# Patient Record
Sex: Female | Born: 1965 | Race: White | Hispanic: No | Marital: Single | Smoking: Never smoker
Health system: Southern US, Community
[De-identification: ages and names within clinical notes are randomized; demographics above are authoritative.]

## PROBLEM LIST (undated history)

## (undated) DIAGNOSIS — K5190 Ulcerative colitis, unspecified, without complications: Secondary | ICD-10-CM

## (undated) DIAGNOSIS — R32 Unspecified urinary incontinence: Secondary | ICD-10-CM

## (undated) DIAGNOSIS — F909 Attention-deficit hyperactivity disorder, unspecified type: Secondary | ICD-10-CM

## (undated) DIAGNOSIS — F32A Depression, unspecified: Secondary | ICD-10-CM

## (undated) DIAGNOSIS — K219 Gastro-esophageal reflux disease without esophagitis: Secondary | ICD-10-CM

## (undated) DIAGNOSIS — F329 Major depressive disorder, single episode, unspecified: Secondary | ICD-10-CM

## (undated) DIAGNOSIS — M810 Age-related osteoporosis without current pathological fracture: Secondary | ICD-10-CM

## (undated) HISTORY — DX: Gastro-esophageal reflux disease without esophagitis: K21.9

## (undated) HISTORY — DX: Attention-deficit hyperactivity disorder, unspecified type: F90.9

## (undated) HISTORY — DX: Ulcerative colitis, unspecified, without complications: K51.90

## (undated) HISTORY — DX: Age-related osteoporosis without current pathological fracture: M81.0

## (undated) HISTORY — PX: COLONOSCOPY: SHX174

## (undated) HISTORY — DX: Unspecified urinary incontinence: R32

## (undated) HISTORY — PX: CHOLECYSTECTOMY: SHX55

## (undated) HISTORY — PX: MOUTH SURGERY: SHX715

## (undated) HISTORY — DX: Depression, unspecified: F32.A

## (undated) HISTORY — PX: APPENDECTOMY: SHX54

## (undated) HISTORY — DX: Major depressive disorder, single episode, unspecified: F32.9

---

## 2002-11-12 ENCOUNTER — Encounter: Payer: Self-pay | Admitting: Emergency Medicine

## 2002-11-12 ENCOUNTER — Emergency Department (HOSPITAL_COMMUNITY): Admission: EM | Admit: 2002-11-12 | Discharge: 2002-11-13 | Payer: Self-pay | Admitting: Emergency Medicine

## 2012-11-27 ENCOUNTER — Other Ambulatory Visit: Payer: Self-pay | Admitting: Certified Nurse Midwife

## 2012-11-27 NOTE — Telephone Encounter (Signed)
Chart in your in basket.

## 2012-11-27 NOTE — Telephone Encounter (Signed)
No AEX scheduled in Epic, patient was last seen 01/11/12. Last Vit D was done at AEX was at 25 no rx was given  Please advise.

## 2016-03-10 ENCOUNTER — Encounter: Payer: Self-pay | Admitting: Obstetrics and Gynecology

## 2016-03-10 ENCOUNTER — Ambulatory Visit (INDEPENDENT_AMBULATORY_CARE_PROVIDER_SITE_OTHER): Payer: BC Managed Care – PPO | Admitting: Obstetrics and Gynecology

## 2016-03-10 VITALS — BP 110/80 | HR 108 | Resp 16 | Ht 64.5 in | Wt 201.0 lb

## 2016-03-10 DIAGNOSIS — N393 Stress incontinence (female) (male): Secondary | ICD-10-CM

## 2016-03-10 DIAGNOSIS — R3915 Urgency of urination: Secondary | ICD-10-CM | POA: Diagnosis not present

## 2016-03-10 DIAGNOSIS — Z01419 Encounter for gynecological examination (general) (routine) without abnormal findings: Secondary | ICD-10-CM

## 2016-03-10 DIAGNOSIS — Z124 Encounter for screening for malignant neoplasm of cervix: Secondary | ICD-10-CM | POA: Diagnosis not present

## 2016-03-10 DIAGNOSIS — N951 Menopausal and female climacteric states: Secondary | ICD-10-CM | POA: Diagnosis not present

## 2016-03-10 DIAGNOSIS — N926 Irregular menstruation, unspecified: Secondary | ICD-10-CM | POA: Diagnosis not present

## 2016-03-10 DIAGNOSIS — Z0000 Encounter for general adult medical examination without abnormal findings: Secondary | ICD-10-CM

## 2016-03-10 DIAGNOSIS — Z23 Encounter for immunization: Secondary | ICD-10-CM

## 2016-03-10 LAB — CBC
HCT: 40.4 % (ref 35.0–45.0)
Hemoglobin: 13.2 g/dL (ref 11.7–15.5)
MCH: 28.5 pg (ref 27.0–33.0)
MCHC: 32.7 g/dL (ref 32.0–36.0)
MCV: 87.3 fL (ref 80.0–100.0)
MPV: 9.9 fL (ref 7.5–12.5)
PLATELETS: 234 10*3/uL (ref 140–400)
RBC: 4.63 MIL/uL (ref 3.80–5.10)
RDW: 14 % (ref 11.0–15.0)
WBC: 7.1 10*3/uL (ref 3.8–10.8)

## 2016-03-10 NOTE — Patient Instructions (Signed)
EXERCISE AND DIET:  We recommended that you start or continue a regular exercise program for good health. Regular exercise means any activity that makes your heart beat faster and makes you sweat.  We recommend exercising at least 30 minutes per day at least 3 days a week, preferably 4 or 5.  We also recommend a diet low in fat and sugar.  Inactivity, poor dietary choices and obesity can cause diabetes, heart attack, stroke, and kidney damage, among others.    ALCOHOL AND SMOKING:  Women should limit their alcohol intake to no more than 7 drinks/beers/glasses of wine (combined, not each!) per week. Moderation of alcohol intake to this level decreases your risk of breast cancer and liver damage. And of course, no recreational drugs are part of a healthy lifestyle.  And absolutely no smoking or even second hand smoke. Most people know smoking can cause heart and lung diseases, but did you know it also contributes to weakening of your bones? Aging of your skin?  Yellowing of your teeth and nails?  CALCIUM AND VITAMIN D:  Adequate intake of calcium and Vitamin D are recommended.  The recommendations for exact amounts of these supplements seem to change often, but generally speaking 600 mg of calcium (either carbonate or citrate) and 800 units of Vitamin D per day seems prudent. Certain women may benefit from higher intake of Vitamin D.  If you are among these women, your doctor will have told you during your visit.    PAP SMEARS:  Pap smears, to check for cervical cancer or precancers,  have traditionally been done yearly, although recent scientific advances have shown that most women can have pap smears less often.  However, every woman still should have a physical exam from her gynecologist every year. It will include a breast check, inspection of the vulva and vagina to check for abnormal growths or skin changes, a visual exam of the cervix, and then an exam to evaluate the size and shape of the uterus and  ovaries.  And after 51 years of age, a rectal exam is indicated to check for rectal cancers. We will also provide age appropriate advice regarding health maintenance, like when you should have certain vaccines, screening for sexually transmitted diseases, bone density testing, colonoscopy, mammograms, etc.   MAMMOGRAMS:  All women over 40 years old should have a yearly mammogram. Many facilities now offer a "3D" mammogram, which may cost around $50 extra out of pocket. If possible,  we recommend you accept the option to have the 3D mammogram performed.  It both reduces the number of women who will be called back for extra views which then turn out to be normal, and it is better than the routine mammogram at detecting truly abnormal areas.    COLONOSCOPY:  Colonoscopy to screen for colon cancer is recommended for all women at age 50.  We know, you hate the idea of the prep.  We agree, BUT, having colon cancer and not knowing it is worse!!  Colon cancer so often starts as a polyp that can be seen and removed at colonscopy, which can quite literally save your life!  And if your first colonoscopy is normal and you have no family history of colon cancer, most women don't have to have it again for 10 years.  Once every ten years, you can do something that may end up saving your life, right?  We will be happy to help you get it scheduled when you are ready.    Be sure to check your insurance coverage so you understand how much it will cost.  It may be covered as a preventative service at no cost, but you should check your particular policy.     Kegel Exercises The goal of Kegel exercises is to isolate and exercise your pelvic floor muscles. These muscles act as a hammock that supports the rectum, vagina, small intestine, and uterus. As the muscles weaken, the hammock sags and these organs are displaced from their normal positions. Kegel exercises can strengthen your pelvic floor muscles and help you to improve  bladder and bowel control, improve sexual response, and help reduce many problems and some discomfort during pregnancy. Kegel exercises can be done anywhere and at any time. HOW TO PERFORM KEGEL EXERCISES 1. Locate your pelvic floor muscles. To do this, squeeze (contract) the muscles that you use when you try to stop the flow of urine. You will feel a tightness in the vaginal area (women) and a tight lift in the rectal area (men and women). 2. When you begin, contract your pelvic muscles tight for 2-5 seconds, then relax them for 2-5 seconds. This is one set. Do 4-5 sets with a short pause in between. 3. Contract your pelvic muscles for 8-10 seconds, then relax them for 8-10 seconds. Do 4-5 sets. If you cannot contract your pelvic muscles for 8-10 seconds, try 5-7 seconds and work your way up to 8-10 seconds. Your goal is 4-5 sets of 10 contractions each day. Keep your stomach, buttocks, and legs relaxed during the exercises. Perform sets of both short and long contractions. Vary your positions. Perform these contractions 3-4 times per day. Perform sets while you are:   Lying in bed in the morning.  Standing at lunch.  Sitting in the late afternoon.  Lying in bed at night. You should do 40-50 contractions per day. Do not perform more Kegel exercises per day than recommended. Overexercising can cause muscle fatigue. Continue these exercises for for at least 15-20 weeks or as directed by your caregiver. This information is not intended to replace advice given to you by your health care provider. Make sure you discuss any questions you have with your health care provider. Document Released: 02/09/2012 Document Revised: 03/15/2014 Document Reviewed: 01/12/2015 Elsevier Interactive Patient Education  2017 [employer]  

## 2016-03-10 NOTE — Progress Notes (Signed)
51 y.o. G0P0000 SingleCaucasianF here for annual exam.  She hasn't had a cycle for 7-11 months, maybe more. Prior to that they were irregular. She had some spotting about a month ago for 2 days.  She has a female partner, lives together x 20 years. Infrequently sexually active, partner struggles with her sexuality. She c/o urinary incontinence with jumping, coughing and sneezing. No urge incontinence. Having urgency to void. No frequency. She leaks a small amount weekly (worse with a cold). Just a few drops.  She has been dealing with incontinence for last 5 years. The urgency has worsened with time.  She is having tolerable night sweats. No hot flashes or vaginal dryness.  She c/o headaches every couple of weeks, can be very bad.     No LMP recorded (lmp unknown). Patient is not currently having periods (Reason: Perimenopausal).          Sexually active: No.  The current method of family planning is none.    Exercising: No Smoker:  no  Health Maintenance: Pap:  4 years ago WNL per patient History of abnormal Pap:  no MMG:  Never Colonoscopy:  Never BMD:  Never  TDaP: unsure Gardasil: N/A   reports that she has never smoked. She has never used smokeless tobacco. She reports that she drinks about 1.2 - 1.8 oz of alcohol per week . She reports that she does not use drugs. 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 She has her masters.   Past Medical History:  Diagnosis Date  . ADHD   . Depression   . Urinary incontinence     Past Surgical History:  Procedure Laterality Date  . CHOLECYSTECTOMY      Current Outpatient Prescriptions  Medication Sig Dispense Refill  . amphetamine-dextroamphetamine (ADDERALL) 20 MG tablet Take 20 mg by mouth daily.    Marland Kitchen PRISTIQ 100 MG 24 hr tablet Take 100 mg by mouth daily.  6  . VYVANSE 50 MG capsule Take 50 mg by mouth daily.  0   No current facility-administered medications for this visit.     Family History  Problem Relation Age of Onset  .  Scoliosis Mother   . Osteoporosis Mother   . Hypertension Father   . Hypertension Maternal Grandmother   . Hodgkin's lymphoma Maternal Grandmother     Review of Systems  Constitutional: Negative.   HENT: Negative.   Eyes: Negative.   Respiratory: Negative.   Cardiovascular: Negative.   Gastrointestinal: Negative.   Endocrine: Negative.   Genitourinary:       Loss of urine with sneezing/ cough  Musculoskeletal: Negative.   Skin: Negative.   Allergic/Immunologic: Negative.   Neurological: Negative.   Psychiatric/Behavioral: Negative.     Exam:   BP 110/80 (BP Location: Right Arm, Patient Position: Sitting, Cuff Size: Normal)   Pulse (!) 108   Resp 16   Ht 5' 4.5" (1.638 m)   Wt 201 lb (91.2 kg)   LMP  (LMP Unknown)   BMI 33.97 kg/m   Weight change: @WEIGHTCHANGE @ Height:   Height: 5' 4.5" (163.8 cm)  Ht Readings from Last 3 Encounters:  03/10/16 5' 4.5" (1.638 m)    General appearance: alert, cooperative and appears stated age Head: Normocephalic, without obvious abnormality, atraumatic Neck: no adenopathy, supple, symmetrical, trachea midline and thyroid normal to inspection and palpation Lungs: clear to auscultation bilaterally Cardiovascular: regular rate and rhythm Breasts: normal appearance, no masses or tenderness Heart: regular rate and rhythm Abdomen: soft, non-tender; bowel sounds  normal; no masses,  no organomegaly Extremities: extremities normal, atraumatic, no cyanosis or edema Skin: Skin color, texture, turgor normal. No rashes or lesions Lymph nodes: Cervical, supraclavicular, and axillary nodes normal. No abnormal inguinal nodes palpated Neurologic: Grossly normal   Pelvic: External genitalia:  no lesions              Urethra:  normal appearing urethra with no masses, tenderness or lesions              Bartholins and Skenes: normal                 Vagina: normal appearing vagina with normal color and discharge, no lesions              Cervix: no  lesions and stenotic               Bimanual Exam:  Uterus:  normal size, contour, position, consistency, mobility, non-tender              Adnexa: no mass, fullness, tenderness               Rectovaginal: Confirms               Anus:  normal sphincter tone, no lesions  Chaperone was present for exam.  A:  Well Woman with normal exam  GSI, some urgency to void  Irregular perimenopausal bleeding  Stenotic cervix    P:   Pap with hpv  Urine for ua, c&s  Screening labs  FSH/estradiol  Recommend she return for a GYN ultrasound, possible endometrial biopsy (will pre-treat with cytotec)  # for GI's given to set up colonoscopy  Mammogram, # given  TDAP

## 2016-03-11 LAB — LIPID PANEL
Cholesterol: 139 mg/dL (ref <200)
HDL: 72 mg/dL (ref >50)
LDL CALC: 55 mg/dL (ref <100)
Total CHOL/HDL Ratio: 1.9 Ratio (ref <5.0)
Triglycerides: 59 mg/dL (ref <150)
VLDL: 12 mg/dL (ref <30)

## 2016-03-11 LAB — COMPREHENSIVE METABOLIC PANEL
ALK PHOS: 80 U/L (ref 33–130)
ALT: 15 U/L (ref 6–29)
AST: 23 U/L (ref 10–35)
Albumin: 4.3 g/dL (ref 3.6–5.1)
BILIRUBIN TOTAL: 0.5 mg/dL (ref 0.2–1.2)
BUN: 13 mg/dL (ref 7–25)
CO2: 21 mmol/L (ref 20–31)
CREATININE: 0.85 mg/dL (ref 0.50–1.05)
Calcium: 9.4 mg/dL (ref 8.6–10.4)
Chloride: 106 mmol/L (ref 98–110)
GLUCOSE: 90 mg/dL (ref 65–99)
Potassium: 4.4 mmol/L (ref 3.5–5.3)
SODIUM: 140 mmol/L (ref 135–146)
Total Protein: 7.6 g/dL (ref 6.1–8.1)

## 2016-03-11 LAB — URINALYSIS, MICROSCOPIC ONLY
Bacteria, UA: NONE SEEN [HPF]
CASTS: NONE SEEN [LPF]
CRYSTALS: NONE SEEN [HPF]
WBC, UA: NONE SEEN WBC/HPF (ref <=5)
YEAST: NONE SEEN [HPF]

## 2016-03-11 LAB — VITAMIN D 25 HYDROXY (VIT D DEFICIENCY, FRACTURES): VIT D 25 HYDROXY: 22 ng/mL — AB (ref 30–100)

## 2016-03-11 LAB — ESTRADIOL: Estradiol: 33 pg/mL

## 2016-03-11 LAB — FOLLICLE STIMULATING HORMONE: FSH: 119.5 m[IU]/mL — AB

## 2016-03-12 ENCOUNTER — Telehealth: Payer: Self-pay | Admitting: *Deleted

## 2016-03-12 LAB — IPS PAP TEST WITH HPV

## 2016-03-12 LAB — URINE CULTURE

## 2016-03-12 NOTE — Telephone Encounter (Signed)
Call to patient, per ROI can leave detailed message on cell number. Left message that Dr Talbert Nan wanted her to know urine results show no sign of UTI and to call back on Monday for additional information. Nothing wrong, no emergency and no sign of UTI.

## 2016-03-12 NOTE — Telephone Encounter (Signed)
-----   Message from Salvadore Dom, MD sent at 03/12/2016  9:51 AM EST ----- Please inform the patient she doesn't have a UTI. Her blood work is in the postmenopausal range, she still has low levels of estradiol which could be stimulating the lining of her uterus (another reason to further evaluate her). She is being set up for an ultrasound and possible biopsy (she is aware). Her vit D is low, she should start taking 1,000 IU of vit D3 daily The rest of her blood work was normal Her pap is still pending

## 2016-03-15 ENCOUNTER — Telehealth: Payer: Self-pay | Admitting: Obstetrics and Gynecology

## 2016-03-15 MED ORDER — MISOPROSTOL 200 MCG PO TABS: ORAL_TABLET | ORAL | 0 refills | Status: DC

## 2016-03-15 NOTE — Telephone Encounter (Signed)
Called patient to review benefits for a recommended procedure. Left Voicemail requesting a call back. °

## 2016-03-15 NOTE — Telephone Encounter (Signed)
Spoke with patient regarding benefit for ultrasound. Patient understood and agreeable. Patient ready to schedule. Patient unable to schedule ultrasound on Tuesday afternoons, due to staff meeting requirements with her employer.  Patient scheduled Thursday, 03/18/16 with Dr Talbert Nan. Patient aware of date, arrival time and cancellation policy.  No further questions.   Routing to Dr Talbert Nan to advise

## 2016-03-15 NOTE — Telephone Encounter (Signed)
Patient is returning your call.  

## 2016-03-15 NOTE — Telephone Encounter (Signed)
See previous encounter regarding result note from Dr Talbert Nan and instructions for endometrial biopsy.   Call back to patient to clarify instructions for Cytotec. Left message to call back.

## 2016-03-15 NOTE — Telephone Encounter (Signed)
Patient returned call. Results reviewed with patient as directed by Dr Talbert Nan. Patient aware of [place] and Estradiol levels as well as recommendation for endometrial biopsy. Will plan to proceed with this with pelvic ultrasound on 03-18-16. Advised of Vitamin D level and recommendation for OTC supplement of 1000 IU daily.  Patient confirmed she received message that urine culture was negative.  Patient is aware to take Motrin with food prior to endometrial biopsy. She has also been instructed to take Cytotec 6-12 hours prior to procedure.   Routing to provider for final review. Patient agreeable to disposition. Will close encounter.

## 2016-03-15 NOTE — Telephone Encounter (Signed)
Call back to patient. Clarified that Cytotec should be inserted vaginally.  Routing to provider for final review. Patient agreeable to disposition. Will close encounter.

## 2016-03-16 ENCOUNTER — Telehealth: Payer: Self-pay | Admitting: Obstetrics & Gynecology

## 2016-03-16 NOTE — Telephone Encounter (Signed)
Return call to patient. Left message to call back. Can also speak with triage nurse.

## 2016-03-16 NOTE — Telephone Encounter (Signed)
Patient wants to speak with you.  Tried to get a little more information but patient refused.

## 2016-03-17 ENCOUNTER — Encounter: Payer: Self-pay | Admitting: Family Medicine

## 2016-03-17 ENCOUNTER — Telehealth: Payer: Self-pay | Admitting: Obstetrics and Gynecology

## 2016-03-17 ENCOUNTER — Ambulatory Visit (INDEPENDENT_AMBULATORY_CARE_PROVIDER_SITE_OTHER): Payer: BC Managed Care – PPO | Admitting: Family Medicine

## 2016-03-17 VITALS — BP 140/92 | HR 105 | Temp 98.6°F | Resp 16 | Ht 64.5 in | Wt 199.0 lb

## 2016-03-17 DIAGNOSIS — Z23 Encounter for immunization: Secondary | ICD-10-CM | POA: Diagnosis not present

## 2016-03-17 DIAGNOSIS — N959 Unspecified menopausal and perimenopausal disorder: Secondary | ICD-10-CM | POA: Diagnosis not present

## 2016-03-17 DIAGNOSIS — J069 Acute upper respiratory infection, unspecified: Secondary | ICD-10-CM | POA: Diagnosis not present

## 2016-03-17 DIAGNOSIS — F322 Major depressive disorder, single episode, severe without psychotic features: Secondary | ICD-10-CM | POA: Diagnosis not present

## 2016-03-17 MED ORDER — FLUTICASONE PROPIONATE 50 MCG/ACT NA SUSP: 2.0000 | Freq: Every day | NASAL | 6 refills | Status: DC

## 2016-03-17 NOTE — Patient Instructions (Addendum)
IF you received an x-ray today, you will receive an invoice from [facility]. Please contact [facility] at [phone] with questions or concerns regarding your invoice.   IF you received labwork today, you will receive an invoice from [facility]. Please contact [facility] at [phone] with questions or concerns regarding your invoice.   Our billing staff will not be able to assist you with questions regarding bills from these companies.  You will be contacted with the lab results as soon as they are available. The fastest way to get your results is to activate your My Chart account. Instructions are located on the last page of this paperwork. If you have not heard from [place] regarding the results in 2 weeks, please contact this office.      Upper Respiratory Infection, Adult Most upper respiratory infections (URIs) are a viral infection of the air passages leading to the lungs. A URI affects the nose, throat, and upper air passages. The most common type of URI is nasopharyngitis and is typically referred to as "the common cold." URIs run their course and usually go away on their own. Most of the time, a URI does not require medical attention, but sometimes a bacterial infection in the upper airways can follow a viral infection. This is called a secondary infection. Sinus and middle ear infections are common types of secondary upper respiratory infections. Bacterial pneumonia can also complicate a URI. A URI can worsen asthma and chronic obstructive pulmonary disease (COPD). Sometimes, these complications can require emergency medical care and may be life threatening. What are the causes? Almost all URIs are caused by viruses. A virus is a type of germ and can spread from one person to another. What increases the risk? You may be at risk for a URI if:  You smoke.  You have chronic heart or lung disease.  You have a weakened defense (immune) system.  You are very  young or very old.  You have nasal allergies or asthma.  You work in crowded or poorly ventilated areas.  You work in health care facilities or schools. What are the signs or symptoms? Symptoms typically develop 2-3 days after you come in contact with a cold virus. Most viral URIs last 7-10 days. However, viral URIs from the influenza virus (flu virus) can last 14-18 days and are typically more severe. Symptoms may include:  Runny or stuffy (congested) nose.  Sneezing.  Cough.  Sore throat.  Headache.  Fatigue.  Fever.  Loss of appetite.  Pain in your forehead, behind your eyes, and over your cheekbones (sinus pain).  Muscle aches. How is this diagnosed? Your health care provider may diagnose a URI by:  Physical exam.  Tests to check that your symptoms are not due to another condition such as:  Strep throat.  Sinusitis.  Pneumonia.  Asthma. How is this treated? A URI goes away on its own with time. It cannot be cured with medicines, but medicines may be prescribed or recommended to relieve symptoms. Medicines may help:  Reduce your fever.  Reduce your cough.  Relieve nasal congestion. Follow these instructions at home:  Take medicines only as directed by your health care provider.  Gargle warm saltwater or take cough drops to comfort your throat as directed by your health care provider.  Use a warm mist humidifier or inhale steam from a shower to increase air moisture. This may make it easier to breathe.  Drink enough fluid to keep your urine clear  or pale yellow.  Eat soups and other clear broths and maintain good nutrition.  Rest as needed.  Return to work when your temperature has returned to normal or as your health care provider advises. You may need to stay home longer to avoid infecting others. You can also use a face mask and careful hand washing to prevent spread of the virus.  Increase the usage of your inhaler if you have asthma.  Do not  use any tobacco products, including cigarettes, chewing tobacco, or electronic cigarettes. If you need help quitting, ask your health care provider. How is this prevented? The best way to protect yourself from getting a cold is to practice good hygiene.  Avoid oral or hand contact with people with cold symptoms.  Wash your hands often if contact occurs. There is no clear evidence that vitamin C, vitamin E, echinacea, or exercise reduces the chance of developing a cold. However, it is always recommended to get plenty of rest, exercise, and practice good nutrition. Contact a health care provider if:  You are getting worse rather than better.  Your symptoms are not controlled by medicine.  You have chills.  You have worsening shortness of breath.  You have brown or red mucus.  You have yellow or brown nasal discharge.  You have pain in your face, especially when you bend forward.  You have a fever.  You have swollen neck glands.  You have pain while swallowing.  You have white areas in the back of your throat. Get help right away if:  You have severe or persistent:  Headache.  Ear pain.  Sinus pain.  Chest pain.  You have chronic lung disease and any of the following:  Wheezing.  Prolonged cough.  Coughing up blood.  A change in your usual mucus.  You have a stiff neck.  You have changes in your:  Vision.  Hearing.  Thinking.  Mood. This information is not intended to replace advice given to you by your health care provider. Make sure you discuss any questions you have with your health care provider. Document Released: 08/18/2000 Document Revised: 10/26/2015 Document Reviewed: 05/30/2013 Elsevier Interactive Patient Education  2017 [place]. Influenza (Flu) Vaccine (Inactivated or Recombinant): What You Need to Know 1. Why get vaccinated? Influenza ("flu") is a contagious disease that spreads around the [place] every year, usually between  October and May. Flu is caused by influenza viruses, and is spread mainly by coughing, sneezing, and close contact. Anyone can get flu. Flu strikes suddenly and can last several days. Symptoms vary by age, but can include:  fever/chills  sore throat  muscle aches  fatigue  cough  headache  runny or stuffy nose Flu can also lead to pneumonia and blood infections, and cause diarrhea and seizures in children. If you have a medical condition, such as heart or lung disease, flu can make it worse. Flu is more dangerous for some people. Infants and young children, people 68 years of age and older, pregnant women, and people with certain health conditions or a weakened immune system are at greatest risk. Each year thousands of people in the [place] die from flu, and many more are hospitalized. Flu vaccine can:  keep you from getting flu,  make flu less severe if you do get it, and  keep you from spreading flu to your family and other people. 2. Inactivated and recombinant flu vaccines A dose of flu vaccine is recommended every flu season. Children 6  months through 44 years of age may need two doses during the same flu season. Everyone else needs only one dose each flu season. Some inactivated flu vaccines contain a very small amount of a mercury-based preservative called thimerosal. Studies have not shown thimerosal in vaccines to be harmful, but flu vaccines that do not contain thimerosal are available. There is no live flu virus in flu shots. They cannot cause the flu. There are many flu viruses, and they are always changing. Each year a new flu vaccine is made to protect against three or four viruses that are likely to cause disease in the upcoming flu season. But even when the vaccine doesn't exactly match these viruses, it may still provide some protection. Flu vaccine cannot prevent:  flu that is caused by a virus not covered by the vaccine, or  illnesses that look like flu but  are not. It takes about 2 weeks for protection to develop after vaccination, and protection lasts through the flu season. 3. Some people should not get this vaccine Tell the person who is giving you the vaccine:  If you have any severe, life-threatening allergies. If you ever had a life-threatening allergic reaction after a dose of flu vaccine, or have a severe allergy to any part of this vaccine, you may be advised not to get vaccinated. Most, but not all, types of flu vaccine contain a small amount of egg protein.  If you ever had Guillain-Barr Syndrome (also called GBS). Some people with a history of GBS should not get this vaccine. This should be discussed with your doctor.  If you are not feeling well. It is usually okay to get flu vaccine when you have a mild illness, but you might be asked to come back when you feel better. 4. Risks of a vaccine reaction With any medicine, including vaccines, there is a chance of reactions. These are usually mild and go away on their own, but serious reactions are also possible. Most people who get a flu shot do not have any problems with it. Minor problems following a flu shot include:  soreness, redness, or swelling where the shot was given  hoarseness  sore, red or itchy eyes  cough  fever  aches  headache  itching  fatigue If these problems occur, they usually begin soon after the shot and last 1 or 2 days. More serious problems following a flu shot can include the following:  There may be a small increased risk of Guillain-Barre Syndrome (GBS) after inactivated flu vaccine. This risk has been estimated at 1 or 2 additional cases per million people vaccinated. This is much lower than the risk of severe complications from flu, which can be prevented by flu vaccine.  Young children who get the flu shot along with pneumococcal vaccine (PCV13) and/or DTaP vaccine at the same time might be slightly more likely to have a seizure caused by  fever. Ask your doctor for more information. Tell your doctor if a child who is getting flu vaccine has ever had a seizure. Problems that could happen after any injected vaccine:  People sometimes faint after a medical procedure, including vaccination. Sitting or lying down for about 15 minutes can help prevent fainting, and injuries caused by a fall. Tell your doctor if you feel dizzy, or have vision changes or ringing in the ears.  Some people get severe pain in the shoulder and have difficulty moving the arm where a shot was given. This happens very rarely.  Any medication can cause a severe allergic reaction. Such reactions from a vaccine are very rare, estimated at about 1 in a million doses, and would happen within a few minutes to a few hours after the vaccination. As with any medicine, there is a very remote chance of a vaccine causing a serious injury or death. The safety of vaccines is always being monitored. For more information, visit: http://www.aguilar.org/ 5. What if there is a serious reaction? What should I look for? Look for anything that concerns you, such as signs of a severe allergic reaction, very high fever, or unusual behavior. Signs of a severe allergic reaction can include hives, swelling of the face and throat, difficulty breathing, a fast heartbeat, dizziness, and weakness. These would start a few minutes to a few hours after the vaccination. What should I do?  If you think it is a severe allergic reaction or other emergency that can't wait, call 9-1-1 and get the person to the nearest hospital. Otherwise, call your doctor.  Reactions should be reported to the Vaccine Adverse Event Reporting System (VAERS). Your doctor should file this report, or you can do it yourself through the VAERS web site at www.vaers.SamedayNews.es, or by calling 979-540-6893.  VAERS does not give medical advice. 6. The National Vaccine Injury Compensation Program The [employer] Vaccine Injury  Compensation Program (VICP) is a federal program that was created to compensate people who may have been injured by certain vaccines. Persons who believe they may have been injured by a vaccine can learn about the program and about filing a claim by calling (320)219-6418 or visiting the [place] website at GoldCloset.com.ee. There is a time limit to file a claim for compensation. 7. How can I learn more?  Ask your healthcare provider. He or she can give you the vaccine package insert or suggest other sources of information.  Call your local or state health department.  Contact the Centers for Disease Control and Prevention (CDC):  Call 416-843-5992 (1-800-CDC-INFO) or  Visit CDC's website at https://gibson.com/ Vaccine Information Statement, Inactivated Influenza Vaccine (10/12/2013) This information is not intended to replace advice given to you by your health care provider. Make sure you discuss any questions you have with your health care provider. Document Released: 12/17/2005 Document Revised: 11/13/2015 Document Reviewed: 11/13/2015 Elsevier Interactive Patient Education  2017 [employer].

## 2016-03-17 NOTE — Telephone Encounter (Signed)
Spoke with Dr. Talbert Nan -agreeable with plan.  Spoke with patient. Advised patient to seek care at urgent care/ER for evaluation of migraine with dizziness and floaters. Advised patient can schedule appt with Dr. Talbert Nan to discuss menopausal symptoms and depression? Patient states she may have someone that can drive her this afternoon, she will see if they can take her to urgent care that is about 3 min away from her house. Patient states she will return call when feeling better to schedule appt with Dr. Talbert Nan. Patient verbalizes understanding and is agreeable.  Routing to provider for final review. Patient is agreeable to disposition. Will close encounter.

## 2016-03-17 NOTE — Telephone Encounter (Signed)
Patient is having a migraines and trouble with night sweats. Patient is asking to come in for an appointment today.

## 2016-03-17 NOTE — Telephone Encounter (Signed)
Spoke with patient. Patient states she has had a migraine for the past 2 days. Patient states she has been sweating a lot, seeing floaters, can't drive, dizzy. Patient states her hormones are out of whack and thinks this related. Patient states she has had an increase in night sweats and hot flashes. Patient states she is unsure if this may be sinus related. Patients states she also has some depression that she sees a therapist for and will see her on Thursday. Patient reports taking advil with no relief. Recommended patient be evaluated at pcp for "migraine". Patient states she does not have a pcp, RN recommended local urgent care or ER. Patient states she does not want to use urgent care if she does not have to. Advised patient we can schedule an appointment with Dr. Talbert Nan to discuss menopausal symptoms? Patient declined. Advised patient will review with Dr. Talbert Nan and return call with any additional recommendations. Patient is agreeable. Last AEX 03/10/16.  Dr. Talbert Nan -please advise?

## 2016-03-17 NOTE — Progress Notes (Signed)
Chief Complaint  Patient presents with  . Headache    sinus pressure/pain since Monday. brownish plegm     HPI  Menopausal State Pt reports that she called her Gynecologist today because she has to have an endometrial biopsy tomorrow afternoon and that her gynecologist told her that her [place] was high and her estradiol low which could be causing her night sweats and migraines. She reports that it has been a year since her last period. She plans to take Cytotec prior to her procedure.  Acute URI She reports that she has sinus pressure and pain for 3 days She reports that she had sinus drainage and facial pressure She reports that she has not had heat over the weekend during the cold wave.  She reports that she was mostly using a space heater because of her dog.  She states that her sinus drainage seems worse due to the dry heat.  She is a Education officer, museum.  Depression- Severe (worsening) She reports that she has been having some more depression symptoms.  She has a Engineer, water and a psychiatrist.  She is currently taking Pristiq for depression and was doing well on it.  She states that her psychiatrist will be considering adding an additional medication to help her symptoms.  She reports that her depressed mood has gotten worse since the changes in the hormones.  She reports that she feels like she does not care if she lives or dies but denies any suicidal thoughts.   Past Medical History:  Diagnosis Date  . ADHD   . Depression   . Urinary incontinence     Current Outpatient Prescriptions  Medication Sig Dispense Refill  . amphetamine-dextroamphetamine (ADDERALL) 20 MG tablet Take 20 mg by mouth daily.    . misoprostol (CYTOTEC) 200 MCG tablet Insert two tablets vaginally 6-12 hours prior to procedure. 2 tablet 0  . PRISTIQ 100 MG 24 hr tablet Take 100 mg by mouth daily.  6  . VYVANSE 50 MG capsule Take 50 mg by mouth daily.  0  . fluticasone (FLONASE) 50 MCG/ACT nasal spray Place 2  sprays into both nostrils daily. 16 g 6   No current facility-administered medications for this visit.     Allergies: No Known Allergies  Past Surgical History:  Procedure Laterality Date  . CHOLECYSTECTOMY      Social History   Social History  . Marital status: Single    Spouse name: N/A  . Number of children: N/A  . Years of education: N/A   Social History Main Topics  . Smoking status: Never Smoker  . Smokeless tobacco: Never Used  . Alcohol use 1.2 - 1.8 oz/week    2 - 3 Standard drinks or equivalent per week  . Drug use: No  . Sexual activity: No   Other Topics Concern  . None   Social History Narrative  . None    ROS  Objective: Vitals:   03/17/16 1551  BP: (!) 140/92  Pulse: (!) 105  Resp: 16  Temp: 98.6 F (37 C)  TempSrc: Oral  SpO2: 97%  Weight: 199 lb (90.3 kg)  Height: 5' 4.5" (1.638 m)    Physical Exam General: alert, oriented, in NAD Head: normocephalic, atraumatic, no sinus tenderness Eyes: EOM intact, no scleral icterus or conjunctival injection Ears: TM clear bilaterally Throat: no pharyngeal exudate or erythema Lymph: no posterior auricular, submental or cervical lymph adenopathy Heart: normal rate, normal sinus rhythm, no murmurs Lungs: clear to auscultation bilaterally, no  wheezing   Assessment and Plan Neddie was seen today for headache.  Diagnoses and all orders for this visit:  Need for prophylactic vaccination and inoculation against influenza -     Flu Vaccine QUAD 36+ mos PF IM (Fluarix & Fluzone Quad PF)  Menopausal and perimenopausal disorder- explained endometrial biopsy and potential results to patient to provider reassurance Follow up with Gyne as instructed  Severe single current episode of major depressive disorder, without psychotic features ([place])- pt has follow up plan with Psychiatry and Psychology Discussed suicide risk factors Advised pt to continue her current meds  Acute URI Supportive care Work note  given -     fluticasone (FLONASE) 50 MCG/ACT nasal spray; Place 2 sprays into both nostrils daily.     [place]

## 2016-03-18 ENCOUNTER — Ambulatory Visit (INDEPENDENT_AMBULATORY_CARE_PROVIDER_SITE_OTHER): Payer: BC Managed Care – PPO

## 2016-03-18 ENCOUNTER — Other Ambulatory Visit: Payer: Self-pay | Admitting: Obstetrics and Gynecology

## 2016-03-18 ENCOUNTER — Encounter: Payer: Self-pay | Admitting: Obstetrics and Gynecology

## 2016-03-18 ENCOUNTER — Ambulatory Visit (INDEPENDENT_AMBULATORY_CARE_PROVIDER_SITE_OTHER): Payer: BC Managed Care – PPO | Admitting: Obstetrics and Gynecology

## 2016-03-18 VITALS — BP 136/80 | HR 100 | Resp 18 | Ht 64.5 in | Wt 199.0 lb

## 2016-03-18 DIAGNOSIS — N926 Irregular menstruation, unspecified: Secondary | ICD-10-CM

## 2016-03-18 DIAGNOSIS — N951 Menopausal and female climacteric states: Secondary | ICD-10-CM | POA: Diagnosis not present

## 2016-03-18 NOTE — Progress Notes (Signed)
GYNECOLOGY  VISIT   HPI: 51 y.o.   Single  Caucasian  female   [id number] with No LMP recorded (lmp unknown). Patient is not currently having periods (Reason: Perimenopausal).  She had an episode of bleeding after 7-11 months without bleeding.  here for   Pelvic US She presents with her partner  GYNECOLOGIC HISTORY: No LMP recorded (lmp unknown). Patient is not currently having periods (Reason: Perimenopausal). Contraception: Post menopausal  Menopausal hormone therapy: None        OB History    Gravida Para Term Preterm AB Living   0 0 0 0 0 0   SAB TAB Ectopic Multiple Live Births   0 0 0 0 0         Patient Active Problem List   Diagnosis Date Noted  . Severe single current episode of major depressive disorder, without psychotic features ([place]) 03/17/2016  . Menopausal and perimenopausal disorder 03/17/2016    Past Medical History:  Diagnosis Date  . ADHD   . Depression   . Urinary incontinence     Past Surgical History:  Procedure Laterality Date  . CHOLECYSTECTOMY      Current Outpatient Prescriptions  Medication Sig Dispense Refill  . amphetamine-dextroamphetamine (ADDERALL) 20 MG tablet Take 20 mg by mouth daily.    . fluticasone (FLONASE) 50 MCG/ACT nasal spray Place 2 sprays into both nostrils daily. 16 g 6  . misoprostol (CYTOTEC) 200 MCG tablet Insert two tablets vaginally 6-12 hours prior to procedure. 2 tablet 0  . PRISTIQ 100 MG 24 hr tablet Take 100 mg by mouth daily.  6  . VYVANSE 50 MG capsule Take 50 mg by mouth daily.  0   No current facility-administered medications for this visit.      ALLERGIES: Patient has no known allergies.  Family History  Problem Relation Age of Onset  . Scoliosis Mother   . Osteoporosis Mother   . Hypertension Father   . Hypertension Maternal Grandmother   . Hodgkin's lymphoma Maternal Grandmother     Social History   Social History  . Marital status: Single    Spouse name: N/A  . Number of children:  N/A  . Years of education: N/A   Occupational History  . Not on file.   Social History Main Topics  . Smoking status: Never Smoker  . Smokeless tobacco: Never Used  . Alcohol use 1.2 - 1.8 oz/week    2 - 3 Standard drinks or equivalent per week  . Drug use: No  . Sexual activity: No   Other Topics Concern  . Not on file   Social History Narrative  . No narrative on file    ROS: complete ROS was negative  PHYSICAL EXAMINATION:    BP 136/80 (BP Location: Right Arm, Patient Position: Sitting, Cuff Size: Large)   Pulse 100   Resp 18   Ht 5' 4.5" (1.638 m)   Wt 199 lb (90.3 kg)   LMP  (LMP Unknown)   BMI 33.63 kg/m     General appearance: alert, cooperative and appears stated age  Pelvic: External genitalia:  no lesions              Urethra:  normal appearing urethra with no masses, tenderness or lesions              Bartholins and Skenes: normal                 Vagina: normal appearing vagina  with normal color and discharge, no lesions              Cervix: no lesions and stenotic  The risks of endometrial biopsy were reviewed and a consent was obtained.  A speculum was placed in the vagina and the cervix was cleansed with betadine. A tenaculum was placed on the cervix and the cervix was dilated with the mini-dilators. The mini-pipelle was placed into the endometrial cavity. The uterus sounded to 7-8 cm. The endometrial biopsy was performed, minimal to moderate tissue was obtained. The tenaculum and speculum were removed. There were no complications.  Of note her cervical canal was slightly irregular, needed to point down slightly then up to get into the endometrial cavity.                Chaperone was present for exam.  Paper ultrasound images were reviewed with the patient and her partner. She has a thickened endometrial stripe of 6.19 mm and a suspected endocervical polyp  ASSESSMENT Irregular peri-menopausal bleeding, PMP FSH, slightly elevated estradiol. U/S with  thickened stripe and possible endocervical polyp    PLAN Endometrial biopsy done Further plan depending on the results Briefly discussed the possibility of hysteroscopy, D&C   An After Visit Summary was printed and given to the patient.  10 minutes face to face time of which over 50% was spent in counseling.

## 2016-03-19 NOTE — Telephone Encounter (Signed)
See appointment from 03-18-16. Encounter closed.

## 2016-03-23 ENCOUNTER — Telehealth: Payer: Self-pay | Admitting: *Deleted

## 2016-03-23 MED ORDER — MEDROXYPROGESTERONE ACETATE 5 MG PO TABS: 5.0000 mg | ORAL_TABLET | Freq: Every day | ORAL | 0 refills | Status: DC

## 2016-03-23 NOTE — Telephone Encounter (Signed)
-----   Message from Salvadore Dom, MD sent at 03/22/2016  5:05 PM EST ----- Please inform the patient that her biopsy is benign, stills show hormonal stimulation. I would recommend that she use cyclic provera, 5 mg every day for 5 days, every other month until she goes 6 months without bleeding. If she has any buildup of the lining of her uterus she will bleed after taking provera. If no build up she won't bleed.  Please call in the provera as above #15 tablets with 1 refill (6 months at a time). She should calendar any bleeding and f/u in 6 months.  If she has random bleeding in between she should call (she does likely have an endocervical polyp, if keeps bleeding would recommend hysteroscopic removal)

## 2016-03-23 NOTE — Telephone Encounter (Signed)
Spoke with patient and explained how to take the Provera and keep note of any bleeding. She was advised to call with abnormal bleeding in between cycles. Called Provera into pharmacy. Patient's questions were answered. 6 month F/U scheduled. Patient voiced understanding -eh

## 2016-03-25 ENCOUNTER — Other Ambulatory Visit: Payer: Self-pay | Admitting: Obstetrics and Gynecology

## 2016-03-25 DIAGNOSIS — Z1231 Encounter for screening mammogram for malignant neoplasm of breast: Secondary | ICD-10-CM

## 2016-04-08 ENCOUNTER — Ambulatory Visit
Admission: RE | Admit: 2016-04-08 | Discharge: 2016-04-08 | Disposition: A | Discharge status: Home / Self Care | Payer: BC Managed Care – PPO | Source: Ambulatory Visit | Attending: Obstetrics and Gynecology | Admitting: Obstetrics and Gynecology

## 2016-04-08 DIAGNOSIS — Z1231 Encounter for screening mammogram for malignant neoplasm of breast: Secondary | ICD-10-CM

## 2016-04-13 ENCOUNTER — Encounter: Payer: Self-pay | Admitting: Physician Assistant

## 2016-04-13 ENCOUNTER — Ambulatory Visit (INDEPENDENT_AMBULATORY_CARE_PROVIDER_SITE_OTHER): Payer: BC Managed Care – PPO | Admitting: Physician Assistant

## 2016-04-13 VITALS — BP 120/78 | HR 104 | Temp 98.3°F | Resp 16 | Ht 65.0 in | Wt 198.0 lb

## 2016-04-13 DIAGNOSIS — R112 Nausea with vomiting, unspecified: Secondary | ICD-10-CM | POA: Diagnosis not present

## 2016-04-13 DIAGNOSIS — R197 Diarrhea, unspecified: Secondary | ICD-10-CM

## 2016-04-13 MED ORDER — ONDANSETRON 4 MG PO TBDP: 4.0000 mg | ORAL_TABLET | Freq: Three times a day (TID) | ORAL | 0 refills | Status: DC | PRN

## 2016-04-13 NOTE — Progress Notes (Signed)
   Samantha Barry  MRN: [id number] DOB: 1965/04/18  Subjective:  Pt presents to clinic with stomach issues for the last 3 days.  Started Sunday night with nausea and vomiting and then she developed diarrhea - she missed work the past 2 days due to her illness.  She has not vomited since last night but is still having some loose stools.  She was unable to eat yesterday due to nausea and today she has been able to eat more but she is still not really hungry.    Review of Systems  Constitutional: Positive for appetite change (decrease), chills and fever (subjective).  HENT: Positive for congestion.   Gastrointestinal: Positive for diarrhea, nausea and vomiting (none since yesterday am).    Patient Active Problem List   Diagnosis Date Noted  . Severe single current episode of major depressive disorder, without psychotic features ([place]) 03/17/2016  . Menopausal and perimenopausal disorder 03/17/2016    Current Outpatient Prescriptions on File Prior to Visit  Medication Sig Dispense Refill  . amphetamine-dextroamphetamine (ADDERALL) 20 MG tablet Take 20 mg by mouth daily.    . fluticasone (FLONASE) 50 MCG/ACT nasal spray Place 2 sprays into both nostrils daily. 16 g 6  . medroxyPROGESTERone (PROVERA) 5 MG tablet Take 1 tablet (5 mg total) by mouth daily. Take 1 tablet PO for 5 days every other month for 6 months 15 tablet 0  . PRISTIQ 100 MG 24 hr tablet Take 100 mg by mouth daily.  6  . VYVANSE 50 MG capsule Take 50 mg by mouth daily.  0  . misoprostol (CYTOTEC) 200 MCG tablet Insert two tablets vaginally 6-12 hours prior to procedure. (Patient not taking: Reported on 04/13/2016) 2 tablet 0   No current facility-administered medications on file prior to visit.     No Known Allergies  Pt patients past, family and social history were reviewed and updated.   Objective:  BP 120/78 (BP Location: Right Arm, Patient Position: Sitting, Cuff Size: Large)   Pulse (!) 104   Temp 98.3 F (36.8 C)  (Oral)   Resp 16   Ht 5' 5"  (1.651 m)   Wt 198 lb (89.8 kg)   SpO2 97%   BMI 32.95 kg/m   Physical Exam  Constitutional: She is oriented to person, place, and time and well-developed, well-nourished, and in no distress.  HENT:  Head: Normocephalic and atraumatic.  Right Ear: Hearing, tympanic membrane, external ear and ear canal normal.  Left Ear: Hearing, tympanic membrane, external ear and ear canal normal.  Nose: Nose normal.  Mouth/Throat: Uvula is midline, oropharynx is clear and moist and mucous membranes are normal.  Eyes: Conjunctivae are normal.  Neck: Normal range of motion.  Cardiovascular: Normal rate, regular rhythm and normal heart sounds.   No murmur heard. Pulmonary/Chest: Effort normal and breath sounds normal.  Abdominal: Soft. Bowel sounds are normal. There is no tenderness.  Neurological: She is alert and oriented to person, place, and time. Gait normal.  Skin: Skin is warm and dry.  Psychiatric: Mood, memory, affect and judgment normal.  Vitals reviewed.   Assessment and Plan :  Nausea and vomiting, intractability of vomiting not specified, unspecified vomiting type - Plan: ondansetron (ZOFRAN ODT) 4 MG disintegrating tablet  Diarrhea, unspecified type   Note given, advance diet as tolerated -   Windell Hummingbird PA-C  [facility] [place] 04/13/2016 5:03 PM

## 2016-04-13 NOTE — Patient Instructions (Addendum)
  Ok to use TUMs, mylanta and maalox for heartburn -- but Zantac can be really helpful if you know that when you eat something that always causes you to have heartburn   IF you received an x-ray today, you will receive an invoice from [facility]. Please contact [facility] at [phone] with questions or concerns regarding your invoice.   IF you received labwork today, you will receive an invoice from [place]. Please contact [facility] at [phone] with questions or concerns regarding your invoice.   Our billing staff will not be able to assist you with questions regarding bills from these companies.  You will be contacted with the lab results as soon as they are available. The fastest way to get your results is to activate your My Chart account. Instructions are located on the last page of this paperwork. If you have not heard from [place] regarding the results in 2 weeks, please contact this office.

## 2016-05-10 ENCOUNTER — Ambulatory Visit (INDEPENDENT_AMBULATORY_CARE_PROVIDER_SITE_OTHER): Payer: BC Managed Care – PPO | Admitting: Family Medicine

## 2016-05-10 VITALS — BP 114/62 | HR 105 | Temp 98.4°F | Ht 65.0 in | Wt 196.2 lb

## 2016-05-10 DIAGNOSIS — J069 Acute upper respiratory infection, unspecified: Secondary | ICD-10-CM

## 2016-05-10 NOTE — Progress Notes (Signed)
Subjective:  By signing my name below, I, Essence Howell, attest that this documentation has been prepared under the direction and in the presence of Wendie Agreste, MD Electronically Signed: Ladene Artist, ED Scribe 05/10/2016 at 4:13 PM.   Patient ID: Samantha Barry, female    DOB: 11-10-1965, 51 y.o.   MRN: [id number]  Chief Complaint  Patient presents with  . Generalized Body Aches    X 1 day with chills  . Sinus Problem    X 1 week   . Sore Throat    X 1 day    HPI Samantha Barry is a 51 y.o. female who presents to Primary Care at [facility] complaining of a sore throat onset yesterday. Pt reports associated symptoms of minimally productive cough onset 3 days ago, low-grade fever of 99 F PTA, chills, hoarseness, postnasal drip, nasal congestion, generalized body aches. Pt takes Flonase for environmental allergies. She has a house mate that was recently diagnosed with a viral sinus infection. Pt is an Automotive engineer.   Patient Active Problem List   Diagnosis Date Noted  . Severe single current episode of major depressive disorder, without psychotic features ([place]) 03/17/2016  . Menopausal and perimenopausal disorder 03/17/2016   Past Medical History:  Diagnosis Date  . ADHD   . Depression   . Urinary incontinence    Past Surgical History:  Procedure Laterality Date  . CHOLECYSTECTOMY     No Known Allergies Prior to Admission medications   Medication Sig Start Date End Date Taking? Authorizing Provider  amphetamine-dextroamphetamine (ADDERALL) 20 MG tablet Take 20 mg by mouth daily.   Yes Historical Provider, MD  fluticasone (FLONASE) 50 MCG/ACT nasal spray Place 2 sprays into both nostrils daily. 03/17/16  Yes Forrest Moron, MD  medroxyPROGESTERone (PROVERA) 5 MG tablet Take 1 tablet (5 mg total) by mouth daily. Take 1 tablet PO for 5 days every other month for 6 months 03/23/16  Yes Salvadore Dom, MD  misoprostol (CYTOTEC) 200 MCG tablet Insert two tablets  vaginally 6-12 hours prior to procedure. 03/15/16  Yes Salvadore Dom, MD  ondansetron (ZOFRAN ODT) 4 MG disintegrating tablet Take 1 tablet (4 mg total) by mouth every 8 (eight) hours as needed for nausea. 04/13/16  Yes Mancel Bale, PA-C  PRISTIQ 100 MG 24 hr tablet Take 100 mg by mouth daily. 02/16/16  Yes Historical Provider, MD  REXULTI 1 MG TABS  03/27/16  Yes Historical Provider, MD  VYVANSE 50 MG capsule Take 50 mg by mouth daily. 01/20/16  Yes Historical Provider, MD   Social History   Social History  . Marital status: Single    Spouse name: N/A  . Number of children: N/A  . Years of education: N/A   Occupational History  . Not on file.   Social History Main Topics  . Smoking status: Never Smoker  . Smokeless tobacco: Never Used  . Alcohol use 1.2 - 1.8 oz/week    2 - 3 Standard drinks or equivalent per week  . Drug use: No  . Sexual activity: No   Other Topics Concern  . Not on file   Social History Narrative   Teacher - Teacher, early years/pre   Review of Systems  Constitutional: Positive for chills.  HENT: Positive for congestion, postnasal drip, sore throat and voice change (hoarseness).   Respiratory: Positive for cough.   Musculoskeletal: Positive for myalgias.      Objective:   Physical Exam  Constitutional: She is oriented  to person, place, and time. She appears well-developed and well-nourished. No distress.  HENT:  Head: Normocephalic and atraumatic.  Right Ear: Hearing, tympanic membrane, external ear and ear canal normal.  Left Ear: Hearing, external ear and ear canal normal.  Nose: Nose normal. Right sinus exhibits no maxillary sinus tenderness and no frontal sinus tenderness. Left sinus exhibits no maxillary sinus tenderness and no frontal sinus tenderness.  Mouth/Throat: Oropharynx is clear and moist. No oropharyngeal exudate.  L TM: partially secluded by cerumen but visualized looks normal.   Eyes: Conjunctivae and EOM are normal. Pupils are  equal, round, and reactive to light.  Cardiovascular: Normal rate, regular rhythm, normal heart sounds and intact distal pulses.   No murmur heard. Pulmonary/Chest: Effort normal and breath sounds normal. No respiratory distress. She has no wheezes. She has no rhonchi.  Lymphadenopathy:    She has no cervical adenopathy.  Neurological: She is alert and oriented to person, place, and time.  Skin: Skin is warm and dry. No rash noted.  Psychiatric: She has a normal mood and affect. Her behavior is normal.  Vitals reviewed.  Vitals:   05/10/16 1525  BP: 114/62  Pulse: (!) 105  Temp: 98.4 F (36.9 C)  TempSrc: Oral  SpO2: 96%  Weight: 196 lb 3.2 oz (89 kg)  Height: 5' 5"  (1.651 m)  .this    Assessment & Plan:   Samantha Barry is a 51 y.o. female Acute upper respiratory infection  - Suspected viral illness, less likely influenza without fever, but viral syndromes discussed. Symptomatic care discussed with Mucinex, Cepacol, Tylenol or NSAIDs, fluids and rest. Out of work note provided, should not return if she is having fevers, or until fever free for 24 hours. RTC precautions were discussed  No orders of the defined types were placed in this encounter.  Patient Instructions    Saline nasal spray atleast 4 times per day, over the counter mucinex or mucinex DM, cepacol or other cough drops as needed. drink plenty of fluids. Rest, and out of work until you are fever free for 24 hours off fever reducing medication such as Tylenol or Advil. Return to the clinic or go to the nearest emergency room if any of your symptoms worsen or new symptoms occur.   Upper Respiratory Infection, Adult Most upper respiratory infections (URIs) are a viral infection of the air passages leading to the lungs. A URI affects the nose, throat, and upper air passages. The most common type of URI is nasopharyngitis and is typically referred to as "the common cold." URIs run their course and usually go away on their  own. Most of the time, a URI does not require medical attention, but sometimes a bacterial infection in the upper airways can follow a viral infection. This is called a secondary infection. Sinus and middle ear infections are common types of secondary upper respiratory infections. Bacterial pneumonia can also complicate a URI. A URI can worsen asthma and chronic obstructive pulmonary disease (COPD). Sometimes, these complications can require emergency medical care and may be life threatening. What are the causes? Almost all URIs are caused by viruses. A virus is a type of germ and can spread from one person to another. What increases the risk? You may be at risk for a URI if:  You smoke.  You have chronic heart or lung disease.  You have a weakened defense (immune) system.  You are very young or very old.  You have nasal allergies or asthma.  You  work in crowded or poorly ventilated areas.  You work in health care facilities or schools. What are the signs or symptoms? Symptoms typically develop 2-3 days after you come in contact with a cold virus. Most viral URIs last 7-10 days. However, viral URIs from the influenza virus (flu virus) can last 14-18 days and are typically more severe. Symptoms may include:  Runny or stuffy (congested) nose.  Sneezing.  Cough.  Sore throat.  Headache.  Fatigue.  Fever.  Loss of appetite.  Pain in your forehead, behind your eyes, and over your cheekbones (sinus pain).  Muscle aches. How is this diagnosed? Your health care provider may diagnose a URI by:  Physical exam.  Tests to check that your symptoms are not due to another condition such as:  Strep throat.  Sinusitis.  Pneumonia.  Asthma. How is this treated? A URI goes away on its own with time. It cannot be cured with medicines, but medicines may be prescribed or recommended to relieve symptoms. Medicines may help:  Reduce your fever.  Reduce your cough.  Relieve  nasal congestion. Follow these instructions at home:  Take medicines only as directed by your health care provider.  Gargle warm saltwater or take cough drops to comfort your throat as directed by your health care provider.  Use a warm mist humidifier or inhale steam from a shower to increase air moisture. This may make it easier to breathe.  Drink enough fluid to keep your urine clear or pale yellow.  Eat soups and other clear broths and maintain good nutrition.  Rest as needed.  Return to work when your temperature has returned to normal or as your health care provider advises. You may need to stay home longer to avoid infecting others. You can also use a face mask and careful hand washing to prevent spread of the virus.  Increase the usage of your inhaler if you have asthma.  Do not use any tobacco products, including cigarettes, chewing tobacco, or electronic cigarettes. If you need help quitting, ask your health care provider. How is this prevented? The best way to protect yourself from getting a cold is to practice good hygiene.  Avoid oral or hand contact with people with cold symptoms.  Wash your hands often if contact occurs. There is no clear evidence that vitamin C, vitamin E, echinacea, or exercise reduces the chance of developing a cold. However, it is always recommended to get plenty of rest, exercise, and practice good nutrition. Contact a health care provider if:  You are getting worse rather than better.  Your symptoms are not controlled by medicine.  You have chills.  You have worsening shortness of breath.  You have brown or red mucus.  You have yellow or brown nasal discharge.  You have pain in your face, especially when you bend forward.  You have a fever.  You have swollen neck glands.  You have pain while swallowing.  You have white areas in the back of your throat. Get help right away if:  You have severe or persistent:  Headache.  Ear  pain.  Sinus pain.  Chest pain.  You have chronic lung disease and any of the following:  Wheezing.  Prolonged cough.  Coughing up blood.  A change in your usual mucus.  You have a stiff neck.  You have changes in your:  Vision.  Hearing.  Thinking.  Mood. This information is not intended to replace advice given to you by your health care  provider. Make sure you discuss any questions you have with your health care provider. Document Released: 08/18/2000 Document Revised: 10/26/2015 Document Reviewed: 05/30/2013 Elsevier Interactive Patient Education  2017 [employer]  Sore Throat A sore throat is pain, burning, irritation, or scratchiness in the throat. When you have a sore throat, you may feel pain or tenderness in your throat when you swallow or talk. Many things can cause a sore throat, including:  An infection.  Seasonal allergies.  Dryness in the air.  Irritants, such as smoke or pollution.  Gastroesophageal reflux disease (GERD).  A tumor. A sore throat is often the first sign of another sickness. It may happen with other symptoms, such as coughing, sneezing, fever, and swollen neck glands. Most sore throats go away without medical treatment. Follow these instructions at home:  Take over-the-counter medicines only as told by your health care provider.  Drink enough fluids to keep your urine clear or pale yellow.  Rest as needed.  To help with pain, try:  Sipping warm liquids, such as broth, herbal tea, or warm water.  Eating or drinking cold or frozen liquids, such as frozen ice pops.  Gargling with a salt-water mixture 3-4 times a day or as needed. To make a salt-water mixture, completely dissolve -1 tsp of salt in 1 cup of warm water.  Sucking on hard candy or throat lozenges.  Putting a cool-mist humidifier in your bedroom at night to moisten the air.  Sitting in the bathroom with the door closed for 5-10 minutes while you run hot water  in the shower.  Do not use any tobacco products, such as cigarettes, chewing tobacco, and e-cigarettes. If you need help quitting, ask your health care provider. Contact a health care provider if:  You have a fever for more than 2-3 days.  You have symptoms that last (are persistent) for more than 2-3 days.  Your throat does not get better within 7 days.  You have a fever and your symptoms suddenly get worse. Get help right away if:  You have difficulty breathing.  You cannot swallow fluids, soft foods, or your saliva.  You have increased swelling in your throat or neck.  You have persistent nausea and vomiting. This information is not intended to replace advice given to you by your health care provider. Make sure you discuss any questions you have with your health care provider. Document Released: 04/01/2004 Document Revised: 10/19/2015 Document Reviewed: 12/13/2014 Elsevier Interactive Patient Education  2017 [employer].   IF you received an x-ray today, you will receive an invoice from [facility]. Please contact [facility] at [phone] with questions or concerns regarding your invoice.   IF you received labwork today, you will receive an invoice from [place]. Please contact [facility] at [phone] with questions or concerns regarding your invoice.   Our billing staff will not be able to assist you with questions regarding bills from these companies.  You will be contacted with the lab results as soon as they are available. The fastest way to get your results is to activate your My Chart account. Instructions are located on the last page of this paperwork. If you have not heard from [place] regarding the results in 2 weeks, please contact this office.       I personally performed the services described in this documentation, which was scribed in my presence. The recorded information has been reviewed and considered for accuracy and completeness, addended  by me as needed, and agree with  information above.  Signed,   Merri Ray, MD [facility] at [facility].  05/12/16 11:52 AM

## 2016-05-10 NOTE — Progress Notes (Deleted)
    MRN: [id number] DOB: 1965-10-29  Subjective:   Samantha Barry is a 51 y.o. female presenting for chief complaint of Generalized Body Aches (X 1 day with chills); Sinus Problem (X 1 week ); and Sore Throat (X 1 day )     Samantha Barry has a current medication list which includes the following prescription(s): amphetamine-dextroamphetamine, fluticasone, medroxyprogesterone, misoprostol, ondansetron, pristiq, rexulti, and vyvanse. Also has No Known Allergies.  Samantha Barry  has a past medical history of ADHD; Depression; and Urinary incontinence. Also  has a past surgical history that includes Cholecystectomy.  Objective:   Vitals: BP 114/62 (BP Location: Right Arm, Patient Position: Sitting, Cuff Size: Small)   Pulse (!) 105   Temp 98.4 F (36.9 C) (Oral)   Ht 5' 5"  (1.651 m)   Wt 196 lb 3.2 oz (89 kg)   SpO2 96%   BMI 32.65 kg/m   Physical Exam  No results found for this or any previous visit (from the past 24 hour(s)).  Assessment and Plan :     Samantha Eagles, PA-C [facility] at [facility] [phone] 05/10/2016  4:05 PM

## 2016-05-10 NOTE — Patient Instructions (Addendum)
Saline nasal spray atleast 4 times per day, over the counter mucinex or mucinex DM, cepacol or other cough drops as needed. drink plenty of fluids. Rest, and out of work until you are fever free for 24 hours off fever reducing medication such as Tylenol or Advil. Return to the clinic or go to the nearest emergency room if any of your symptoms worsen or new symptoms occur.   Upper Respiratory Infection, Adult Most upper respiratory infections (URIs) are a viral infection of the air passages leading to the lungs. A URI affects the nose, throat, and upper air passages. The most common type of URI is nasopharyngitis and is typically referred to as "the common cold." URIs run their course and usually go away on their own. Most of the time, a URI does not require medical attention, but sometimes a bacterial infection in the upper airways can follow a viral infection. This is called a secondary infection. Sinus and middle ear infections are common types of secondary upper respiratory infections. Bacterial pneumonia can also complicate a URI. A URI can worsen asthma and chronic obstructive pulmonary disease (COPD). Sometimes, these complications can require emergency medical care and may be life threatening. What are the causes? Almost all URIs are caused by viruses. A virus is a type of germ and can spread from one person to another. What increases the risk? You may be at risk for a URI if:  You smoke.  You have chronic heart or lung disease.  You have a weakened defense (immune) system.  You are very young or very old.  You have nasal allergies or asthma.  You work in crowded or poorly ventilated areas.  You work in health care facilities or schools. What are the signs or symptoms? Symptoms typically develop 2-3 days after you come in contact with a cold virus. Most viral URIs last 7-10 days. However, viral URIs from the influenza virus (flu virus) can last 14-18 days and are typically more  severe. Symptoms may include:  Runny or stuffy (congested) nose.  Sneezing.  Cough.  Sore throat.  Headache.  Fatigue.  Fever.  Loss of appetite.  Pain in your forehead, behind your eyes, and over your cheekbones (sinus pain).  Muscle aches. How is this diagnosed? Your health care provider may diagnose a URI by:  Physical exam.  Tests to check that your symptoms are not due to another condition such as:  Strep throat.  Sinusitis.  Pneumonia.  Asthma. How is this treated? A URI goes away on its own with time. It cannot be cured with medicines, but medicines may be prescribed or recommended to relieve symptoms. Medicines may help:  Reduce your fever.  Reduce your cough.  Relieve nasal congestion. Follow these instructions at home:  Take medicines only as directed by your health care provider.  Gargle warm saltwater or take cough drops to comfort your throat as directed by your health care provider.  Use a warm mist humidifier or inhale steam from a shower to increase air moisture. This may make it easier to breathe.  Drink enough fluid to keep your urine clear or pale yellow.  Eat soups and other clear broths and maintain good nutrition.  Rest as needed.  Return to work when your temperature has returned to normal or as your health care provider advises. You may need to stay home longer to avoid infecting others. You can also use a face mask and careful hand washing to prevent spread of the virus.  Increase the usage of your inhaler if you have asthma.  Do not use any tobacco products, including cigarettes, chewing tobacco, or electronic cigarettes. If you need help quitting, ask your health care provider. How is this prevented? The best way to protect yourself from getting a cold is to practice good hygiene.  Avoid oral or hand contact with people with cold symptoms.  Wash your hands often if contact occurs. There is no clear evidence that vitamin C,  vitamin E, echinacea, or exercise reduces the chance of developing a cold. However, it is always recommended to get plenty of rest, exercise, and practice good nutrition. Contact a health care provider if:  You are getting worse rather than better.  Your symptoms are not controlled by medicine.  You have chills.  You have worsening shortness of breath.  You have brown or red mucus.  You have yellow or brown nasal discharge.  You have pain in your face, especially when you bend forward.  You have a fever.  You have swollen neck glands.  You have pain while swallowing.  You have white areas in the back of your throat. Get help right away if:  You have severe or persistent:  Headache.  Ear pain.  Sinus pain.  Chest pain.  You have chronic lung disease and any of the following:  Wheezing.  Prolonged cough.  Coughing up blood.  A change in your usual mucus.  You have a stiff neck.  You have changes in your:  Vision.  Hearing.  Thinking.  Mood. This information is not intended to replace advice given to you by your health care provider. Make sure you discuss any questions you have with your health care provider. Document Released: 08/18/2000 Document Revised: 10/26/2015 Document Reviewed: 05/30/2013 Elsevier Interactive Patient Education  2017 [employer]  Sore Throat A sore throat is pain, burning, irritation, or scratchiness in the throat. When you have a sore throat, you may feel pain or tenderness in your throat when you swallow or talk. Many things can cause a sore throat, including:  An infection.  Seasonal allergies.  Dryness in the air.  Irritants, such as smoke or pollution.  Gastroesophageal reflux disease (GERD).  A tumor. A sore throat is often the first sign of another sickness. It may happen with other symptoms, such as coughing, sneezing, fever, and swollen neck glands. Most sore throats go away without medical treatment. Follow  these instructions at home:  Take over-the-counter medicines only as told by your health care provider.  Drink enough fluids to keep your urine clear or pale yellow.  Rest as needed.  To help with pain, try:  Sipping warm liquids, such as broth, herbal tea, or warm water.  Eating or drinking cold or frozen liquids, such as frozen ice pops.  Gargling with a salt-water mixture 3-4 times a day or as needed. To make a salt-water mixture, completely dissolve -1 tsp of salt in 1 cup of warm water.  Sucking on hard candy or throat lozenges.  Putting a cool-mist humidifier in your bedroom at night to moisten the air.  Sitting in the bathroom with the door closed for 5-10 minutes while you run hot water in the shower.  Do not use any tobacco products, such as cigarettes, chewing tobacco, and e-cigarettes. If you need help quitting, ask your health care provider. Contact a health care provider if:  You have a fever for more than 2-3 days.  You have symptoms that last (are persistent) for  more than 2-3 days.  Your throat does not get better within 7 days.  You have a fever and your symptoms suddenly get worse. Get help right away if:  You have difficulty breathing.  You cannot swallow fluids, soft foods, or your saliva.  You have increased swelling in your throat or neck.  You have persistent nausea and vomiting. This information is not intended to replace advice given to you by your health care provider. Make sure you discuss any questions you have with your health care provider. Document Released: 04/01/2004 Document Revised: 10/19/2015 Document Reviewed: 12/13/2014 Elsevier Interactive Patient Education  2017 [employer].   IF you received an x-ray today, you will receive an invoice from [facility]. Please contact [facility] at [phone] with questions or concerns regarding your invoice.   IF you received labwork today, you will receive an invoice  from [place]. Please contact [facility] at [phone] with questions or concerns regarding your invoice.   Our billing staff will not be able to assist you with questions regarding bills from these companies.  You will be contacted with the lab results as soon as they are available. The fastest way to get your results is to activate your My Chart account. Instructions are located on the last page of this paperwork. If you have not heard from [place] regarding the results in 2 weeks, please contact this office.

## 2016-05-14 ENCOUNTER — Ambulatory Visit (INDEPENDENT_AMBULATORY_CARE_PROVIDER_SITE_OTHER): Payer: BC Managed Care – PPO | Admitting: Family Medicine

## 2016-05-14 VITALS — BP 125/81 | HR 93 | Temp 97.9°F | Resp 16 | Ht 65.0 in | Wt 193.2 lb

## 2016-05-14 DIAGNOSIS — J069 Acute upper respiratory infection, unspecified: Secondary | ICD-10-CM

## 2016-05-14 DIAGNOSIS — H6122 Impacted cerumen, left ear: Secondary | ICD-10-CM

## 2016-05-14 MED ORDER — AMOXICILLIN-POT CLAVULANATE 875-125 MG PO TABS: 1.0000 | ORAL_TABLET | Freq: Two times a day (BID) | ORAL | 0 refills | Status: DC

## 2016-05-14 NOTE — Progress Notes (Signed)
Chief Complaint  Patient presents with  . Follow-up  . Cough    states her sinus is worse; throat is better  . congestion    HPI   Pt reports that he was seen on Monday 05/10/16 for acute respiratory infection She reports that she has been having coughing and sore throat She reports that she has had cough with yellow mucus She reports that she has sinus headache as well.  She reports that she had some chills at home She is currently doing cepacol and mucinex She reports that the cepachol helps and advil has also been helping. She reports that she has been having postnasal drip.   She reports pressure in her left ear. She denies dizziness.  Past Medical History:  Diagnosis Date  . ADHD   . Depression   . Urinary incontinence     Current Outpatient Prescriptions  Medication Sig Dispense Refill  . amphetamine-dextroamphetamine (ADDERALL) 20 MG tablet Take 20 mg by mouth daily.    . fluticasone (FLONASE) 50 MCG/ACT nasal spray Place 2 sprays into both nostrils daily. 16 g 6  . medroxyPROGESTERone (PROVERA) 5 MG tablet Take 1 tablet (5 mg total) by mouth daily. Take 1 tablet PO for 5 days every other month for 6 months 15 tablet 0  . misoprostol (CYTOTEC) 200 MCG tablet Insert two tablets vaginally 6-12 hours prior to procedure. 2 tablet 0  . ondansetron (ZOFRAN ODT) 4 MG disintegrating tablet Take 1 tablet (4 mg total) by mouth every 8 (eight) hours as needed for nausea. 20 tablet 0  . PRISTIQ 100 MG 24 hr tablet Take 100 mg by mouth daily.  6  . REXULTI 1 MG TABS     . VYVANSE 50 MG capsule Take 50 mg by mouth daily.  0  . amoxicillin-clavulanate (AUGMENTIN) 875-125 MG tablet Take 1 tablet by mouth 2 (two) times daily. 20 tablet 0   No current facility-administered medications for this visit.     Allergies: No Known Allergies  Past Surgical History:  Procedure Laterality Date  . CHOLECYSTECTOMY      Social History   Social History  . Marital status: Single    Spouse  name: N/A  . Number of children: N/A  . Years of education: N/A   Social History Main Topics  . Smoking status: Never Smoker  . Smokeless tobacco: Never Used  . Alcohol use 1.2 - 1.8 oz/week    2 - 3 Standard drinks or equivalent per week  . Drug use: No  . Sexual activity: No   Other Topics Concern  . None   Social History Narrative   Teacher - Teacher, early years/pre    ROS See HPI  Objective: Vitals:   05/14/16 1459  BP: 125/81  Pulse: 93  Resp: 16  Temp: 97.9 F (36.6 C)  TempSrc: Oral  SpO2: 97%  Weight: 193 lb 3.2 oz (87.6 kg)  Height: 5' 5"  (1.651 m)    Physical Exam General: alert, oriented, in NAD Head: normocephalic, atraumatic, no sinus tenderness Eyes: EOM intact, no scleral icterus or conjunctival injection Ears: TM clear bilaterally Throat: no pharyngeal exudate or erythema Lymph: no posterior auricular, submental or cervical lymph adenopathy Heart: normal rate, normal sinus rhythm, no murmurs Lungs: clear to auscultation bilaterally, no wheezing   Assessment and Plan Cecille was seen today for follow-up, cough and congestion.  Diagnoses and all orders for this visit:  Acute upper respiratory infection Discussed viral etiology Discussed otc decongestant Advised flonase for congestion  Increase hydration Vitamin C and zinc lozenges suggested Return to clinic if symptoms worse Gave augmentin prescription to take with handout outlining when to take (fevers, pain in teeth, persistent green mucus and facial pain)  Impacted cerumen of left ear- resolved with ear lavage -     Ear wax removal  Other orders -     amoxicillin-clavulanate (AUGMENTIN) 875-125 MG tablet; Take 1 tablet by mouth 2 (two) times daily.     [place]

## 2016-05-14 NOTE — Patient Instructions (Addendum)
     IF you received an x-ray today, you will receive an invoice from [facility]. Please contact [facility] at [phone] with questions or concerns regarding your invoice.   IF you received labwork today, you will receive an invoice from [place]. Please contact [facility] at [phone] with questions or concerns regarding your invoice.   Our billing staff will not be able to assist you with questions regarding bills from these companies.  You will be contacted with the lab results as soon as they are available. The fastest way to get your results is to activate your My Chart account. Instructions are located on the last page of this paperwork. If you have not heard from [place] regarding the results in 2 weeks, please contact this office.      Sinusitis, Adult Sinusitis is soreness and inflammation of your sinuses. Sinuses are hollow spaces in the bones around your face. They are located:  Around your eyes.  In the middle of your forehead.  Behind your nose.  In your cheekbones. Your sinuses and nasal passages are lined with a stringy fluid (mucus). Mucus normally drains out of your sinuses. When your nasal tissues get inflamed or swollen, the mucus can get trapped or blocked so air cannot flow through your sinuses. This lets bacteria, viruses, and funguses grow, and that leads to infection. Follow these instructions at home: Medicines   Take, use, or apply over-the-counter and prescription medicines only as told by your doctor. These may include nasal sprays.  If you were prescribed an antibiotic medicine, take it as told by your doctor. Do not stop taking the antibiotic even if you start to feel better. Hydrate and Humidify   Drink enough water to keep your pee (urine) clear or pale yellow.  Use a cool mist humidifier to keep the humidity level in your home above 50%.  Breathe in steam for 10-15 minutes, 3-4 times a day or as told by your doctor. You can do  this in the bathroom while a hot shower is running.  Try not to spend time in cool or dry air. Rest   Rest as much as possible.  Sleep with your head raised (elevated).  Make sure to get enough sleep each night. General instructions   Put a warm, moist washcloth on your face 3-4 times a day or as told by your doctor. This will help with discomfort.  Wash your hands often with soap and water. If there is no soap and water, use hand sanitizer.  Do not smoke. Avoid being around people who are smoking (secondhand smoke).  Keep all follow-up visits as told by your doctor. This is important. Contact a doctor if:  You have a fever.  Your symptoms get worse.  Your symptoms do not get better within 10 days. Get help right away if:  You have a very bad headache.  You cannot stop throwing up (vomiting).  You have pain or swelling around your face or eyes.  You have trouble seeing.  You feel confused.  Your neck is stiff.  You have trouble breathing. This information is not intended to replace advice given to you by your health care provider. Make sure you discuss any questions you have with your health care provider. Document Released: 08/11/2007 Document Revised: 10/19/2015 Document Reviewed: 12/18/2014 Elsevier Interactive Patient Education  2017 [employer].

## 2016-09-21 ENCOUNTER — Encounter: Payer: Self-pay | Admitting: Obstetrics and Gynecology

## 2016-09-21 ENCOUNTER — Ambulatory Visit (INDEPENDENT_AMBULATORY_CARE_PROVIDER_SITE_OTHER): Payer: BC Managed Care – PPO | Admitting: Obstetrics and Gynecology

## 2016-09-21 VITALS — BP 100/62 | HR 84 | Resp 18 | Wt 168.0 lb

## 2016-09-21 DIAGNOSIS — N951 Menopausal and female climacteric states: Secondary | ICD-10-CM | POA: Diagnosis not present

## 2016-09-21 NOTE — Progress Notes (Signed)
GYNECOLOGY  VISIT   HPI: 51 y.o.   Single  Caucasian  female   [id number] with No LMP recorded. Patient is perimenopausal.   here for follow up for irregular bleeding and Provera. The patient was seen in 1/18 with irregular peri-menopausal bleeding. PMP FSH, slightly elevated estradiol. U/S with thickened stripe and possible endocervical polyp. Endometrial biopsy with degenerating secretory endometrium. She took the provera in 1/18, 3/18, and 5/18. No bleeding any of those times. Her vasomotor symptoms are better. She is on higher doses of antidepressants.  Sexually active, no pain.  She has had some rectal bleeding with BM for the last 3-4 months, not every time. No constipated, more loose stools, no pain. No blood mixed in with her stool. No black/tarry stool. She has seen a GI MD and is being set up for a colonoscopy.     GYNECOLOGIC HISTORY: No LMP recorded. Patient is perimenopausal. Contraception: None  Menopausal hormone therapy: none         OB History    Gravida Para Term Preterm AB Living   0 0 0 0 0 0   SAB TAB Ectopic Multiple Live Births   0 0 0 0 0         Patient Active Problem List   Diagnosis Date Noted  . Severe single current episode of major depressive disorder, without psychotic features ([place]) 03/17/2016  . Menopausal and perimenopausal disorder 03/17/2016    Past Medical History:  Diagnosis Date  . ADHD   . Depression   . Urinary incontinence     Past Surgical History:  Procedure Laterality Date  . CHOLECYSTECTOMY      Current Outpatient Prescriptions  Medication Sig Dispense Refill  . ALPRAZolam (XANAX) 0.25 MG tablet Take 0.25 mg by mouth at bedtime as needed for anxiety.    Marland Kitchen amphetamine-dextroamphetamine (ADDERALL) 20 MG tablet Take 20 mg by mouth daily.    . Doxepin HCl (SILENOR) 3 MG TABS Take by mouth.    . fluticasone (FLONASE) 50 MCG/ACT nasal spray Place 2 sprays into both nostrils daily. 16 g 6  . medroxyPROGESTERone (PROVERA) 5 MG tablet  Take 1 tablet (5 mg total) by mouth daily. Take 1 tablet PO for 5 days every other month for 6 months 15 tablet 0  . ondansetron (ZOFRAN ODT) 4 MG disintegrating tablet Take 1 tablet (4 mg total) by mouth every 8 (eight) hours as needed for nausea. 20 tablet 0  . PRISTIQ 100 MG 24 hr tablet Take 100 mg by mouth daily.  6  . REXULTI 1 MG TABS     . VYVANSE 50 MG capsule Take 50 mg by mouth daily.  0   No current facility-administered medications for this visit.      ALLERGIES: Patient has no known allergies.  Family History  Problem Relation Age of Onset  . Scoliosis Mother   . Osteoporosis Mother   . Hypertension Father   . Hypertension Maternal Grandmother   . Hodgkin's lymphoma Maternal Grandmother     Social History   Social History  . Marital status: Single    Spouse name: N/A  . Number of children: N/A  . Years of education: N/A   Occupational History  . Not on file.   Social History Main Topics  . Smoking status: Never Smoker  . Smokeless tobacco: Never Used  . Alcohol use 1.2 - 1.8 oz/week    2 - 3 Standard drinks or equivalent per week  . Drug use: No  .  Sexual activity: No   Other Topics Concern  . Not on file   Social History Narrative   Teacher - Teacher, early years/pre    Review of Systems  Constitutional: Negative.   HENT: Negative.   Eyes: Negative.   Respiratory: Negative.   Cardiovascular: Negative.   Gastrointestinal: Negative.   Genitourinary: Negative.   Musculoskeletal: Negative.   Skin: Negative.   Neurological: Negative.   Endo/Heme/Allergies: Negative.   Psychiatric/Behavioral: Negative.     PHYSICAL EXAMINATION:    BP 100/62 (BP Location: Right Arm, Patient Position: Sitting, Cuff Size: Normal)   Pulse 84   Resp 18   Wt 168 lb (76.2 kg)   BMI 27.96 kg/m     General appearance: alert, cooperative and appears stated age  ASSESSMENT F/U for abnormal uterine bleeding in perimenopause. No bleeding in the last 6 months even with  provera Rectal bleeding with BM, she has already seen GI, just needs to set up her colonoscopy     PLAN She can stop the provera Call with any bleeding Urged her to make the colonoscopy appointment F/U for an annual exam in 1/19   An After Visit Summary was printed and given to the patient.

## 2017-04-25 ENCOUNTER — Other Ambulatory Visit: Payer: Self-pay

## 2017-04-25 ENCOUNTER — Observation Stay (HOSPITAL_COMMUNITY)
Admission: EM | Admit: 2017-04-25 | Discharge: 2017-04-26 | Disposition: A | Discharge status: Home / Self Care | Payer: BC Managed Care – PPO | Attending: General Surgery | Admitting: General Surgery

## 2017-04-25 ENCOUNTER — Telehealth: Payer: Self-pay | Admitting: Family Medicine

## 2017-04-25 ENCOUNTER — Encounter (HOSPITAL_COMMUNITY): Payer: Self-pay | Admitting: Emergency Medicine

## 2017-04-25 ENCOUNTER — Encounter (HOSPITAL_COMMUNITY)
Admission: EM | Disposition: A | Discharge status: Home / Self Care | Payer: Self-pay | Source: Home / Self Care | Attending: Emergency Medicine

## 2017-04-25 ENCOUNTER — Encounter: Payer: Self-pay | Admitting: Family Medicine

## 2017-04-25 ENCOUNTER — Emergency Department (HOSPITAL_COMMUNITY): Payer: BC Managed Care – PPO | Admitting: Certified Registered Nurse Anesthetist

## 2017-04-25 ENCOUNTER — Ambulatory Visit: Payer: BC Managed Care – PPO | Admitting: Family Medicine

## 2017-04-25 ENCOUNTER — Ambulatory Visit (HOSPITAL_COMMUNITY)
Admission: RE | Admit: 2017-04-25 | Discharge: 2017-04-25 | Disposition: A | Discharge status: Home / Self Care | Payer: BC Managed Care – PPO | Source: Ambulatory Visit | Attending: Family Medicine | Admitting: Family Medicine

## 2017-04-25 ENCOUNTER — Ambulatory Visit: Payer: Self-pay | Admitting: *Deleted

## 2017-04-25 VITALS — BP 125/84 | HR 109 | Temp 99.8°F | Resp 16 | Ht 65.0 in | Wt 173.6 lb

## 2017-04-25 DIAGNOSIS — R000 Tachycardia, unspecified: Secondary | ICD-10-CM | POA: Diagnosis not present

## 2017-04-25 DIAGNOSIS — K37 Unspecified appendicitis: Secondary | ICD-10-CM | POA: Diagnosis present

## 2017-04-25 DIAGNOSIS — R1031 Right lower quadrant pain: Secondary | ICD-10-CM

## 2017-04-25 DIAGNOSIS — R32 Unspecified urinary incontinence: Secondary | ICD-10-CM | POA: Insufficient documentation

## 2017-04-25 DIAGNOSIS — F329 Major depressive disorder, single episode, unspecified: Secondary | ICD-10-CM | POA: Diagnosis not present

## 2017-04-25 DIAGNOSIS — K449 Diaphragmatic hernia without obstruction or gangrene: Secondary | ICD-10-CM | POA: Diagnosis not present

## 2017-04-25 DIAGNOSIS — K3589 Other acute appendicitis without perforation or gangrene: Principal | ICD-10-CM | POA: Insufficient documentation

## 2017-04-25 DIAGNOSIS — Z8249 Family history of ischemic heart disease and other diseases of the circulatory system: Secondary | ICD-10-CM | POA: Insufficient documentation

## 2017-04-25 DIAGNOSIS — Z9049 Acquired absence of other specified parts of digestive tract: Secondary | ICD-10-CM | POA: Insufficient documentation

## 2017-04-25 DIAGNOSIS — F909 Attention-deficit hyperactivity disorder, unspecified type: Secondary | ICD-10-CM | POA: Diagnosis not present

## 2017-04-25 DIAGNOSIS — Z8262 Family history of osteoporosis: Secondary | ICD-10-CM | POA: Insufficient documentation

## 2017-04-25 DIAGNOSIS — Z8269 Family history of other diseases of the musculoskeletal system and connective tissue: Secondary | ICD-10-CM | POA: Insufficient documentation

## 2017-04-25 DIAGNOSIS — R1010 Upper abdominal pain, unspecified: Secondary | ICD-10-CM

## 2017-04-25 DIAGNOSIS — R630 Anorexia: Secondary | ICD-10-CM

## 2017-04-25 DIAGNOSIS — Z79899 Other long term (current) drug therapy: Secondary | ICD-10-CM | POA: Insufficient documentation

## 2017-04-25 DIAGNOSIS — Z807 Family history of other malignant neoplasms of lymphoid, hematopoietic and related tissues: Secondary | ICD-10-CM | POA: Insufficient documentation

## 2017-04-25 HISTORY — PX: LAPAROSCOPIC APPENDECTOMY: SHX408

## 2017-04-25 LAB — POCT CBC
GRANULOCYTE PERCENT: 5.8 % — AB (ref 37–80)
HEMATOCRIT: 38.3 % (ref 37.7–47.9)
HEMOGLOBIN: 12.3 g/dL (ref 12.2–16.2)
LYMPH, POC: 22.7 — AB (ref 0.6–3.4)
MCH, POC: 27 pg (ref 27–31.2)
MCHC: 32.1 g/dL (ref 31.8–35.4)
MCV: 83.9 fL (ref 80–97)
MID (cbc): 66.8 — AB (ref 0–0.9)
MPV: 7.7 fL (ref 0–99.8)
PLATELET COUNT, POC: 183 10*3/uL (ref 142–424)
POC GRANULOCYTE: 0.9 — AB (ref 2–6.9)
POC LYMPH %: 10.5 % (ref 10–50)
POC MID %: 2 %M (ref 0–12)
RBC: 4.57 M/uL (ref 4.04–5.48)
RDW, POC: 14.4 %
WBC: 8.7 10*3/uL (ref 4.6–10.2)

## 2017-04-25 LAB — BASIC METABOLIC PANEL
Anion gap: 8 (ref 5–15)
BUN: 7 mg/dL (ref 6–20)
CO2: 24 mmol/L (ref 22–32)
Calcium: 8.8 mg/dL — ABNORMAL LOW (ref 8.9–10.3)
Chloride: 104 mmol/L (ref 101–111)
Creatinine, Ser: 0.88 mg/dL (ref 0.44–1.00)
GFR calc Af Amer: >60 mL/min (ref >60)
GFR calc non Af Amer: >60 mL/min (ref >60)
Glucose, Bld: 160 mg/dL — ABNORMAL HIGH (ref 65–99)
Potassium: 3.9 mmol/L (ref 3.5–5.1)
Sodium: 136 mmol/L (ref 135–145)

## 2017-04-25 LAB — POCT URINALYSIS DIP (MANUAL ENTRY)
BILIRUBIN UA: NEGATIVE
BILIRUBIN UA: NEGATIVE mg/dL
GLUCOSE UA: NEGATIVE mg/dL
Leukocytes, UA: NEGATIVE
Nitrite, UA: NEGATIVE
Protein Ur, POC: NEGATIVE mg/dL
Spec Grav, UA: 1.015 (ref 1.010–1.025)
Urobilinogen, UA: 0.2 E.U./dL
pH, UA: 5.5 (ref 5.0–8.0)

## 2017-04-25 SURGERY — APPENDECTOMY, LAPAROSCOPIC
Anesthesia: General | Site: Abdomen

## 2017-04-25 MED ORDER — LACTATED RINGERS IR SOLN
Status: DC | PRN
  Administered 2017-04-25: 1000 mL

## 2017-04-25 MED ORDER — METRONIDAZOLE IN NACL 5-0.79 MG/ML-% IV SOLN
500.0000 mg | Freq: Three times a day (TID) | INTRAVENOUS | Status: DC
  Administered 2017-04-25: 500 mg via INTRAVENOUS
  Filled 2017-04-25 (×2): qty 100

## 2017-04-25 MED ORDER — IOPAMIDOL (ISOVUE-300) INJECTION 61%
INTRAVENOUS | Status: AC
  Administered 2017-04-25: 30 mL via ORAL
  Filled 2017-04-25: qty 100

## 2017-04-25 MED ORDER — ROCURONIUM BROMIDE 10 MG/ML (PF) SYRINGE
PREFILLED_SYRINGE | INTRAVENOUS | Status: AC
  Filled 2017-04-25: qty 5

## 2017-04-25 MED ORDER — SODIUM CHLORIDE 0.9 % IV BOLUS (SEPSIS)
1000.0000 mL | Freq: Once | INTRAVENOUS | Status: AC
  Administered 2017-04-25: 1000 mL via INTRAVENOUS

## 2017-04-25 MED ORDER — PROMETHAZINE HCL 25 MG/ML IJ SOLN: 6.2500 mg | INTRAMUSCULAR | Status: DC | PRN

## 2017-04-25 MED ORDER — LIDOCAINE 2% (20 MG/ML) 5 ML SYRINGE
INTRAMUSCULAR | Status: DC | PRN
  Administered 2017-04-25: 80 mg via INTRAVENOUS

## 2017-04-25 MED ORDER — PHENYLEPHRINE 40 MCG/ML (10ML) SYRINGE FOR IV PUSH (FOR BLOOD PRESSURE SUPPORT)
PREFILLED_SYRINGE | INTRAVENOUS | Status: DC | PRN
  Administered 2017-04-25 (×2): 80 ug via INTRAVENOUS

## 2017-04-25 MED ORDER — LIDOCAINE 2% (20 MG/ML) 5 ML SYRINGE
INTRAMUSCULAR | Status: AC
  Filled 2017-04-25: qty 5

## 2017-04-25 MED ORDER — ONDANSETRON HCL 4 MG/2ML IJ SOLN
INTRAMUSCULAR | Status: AC
  Filled 2017-04-25: qty 2

## 2017-04-25 MED ORDER — FENTANYL CITRATE (PF) 250 MCG/5ML IJ SOLN
INTRAMUSCULAR | Status: AC
  Filled 2017-04-25: qty 5

## 2017-04-25 MED ORDER — 0.9 % SODIUM CHLORIDE (POUR BTL) OPTIME
TOPICAL | Status: DC | PRN
  Administered 2017-04-25: 1000 mL

## 2017-04-25 MED ORDER — HYDROMORPHONE HCL 1 MG/ML IJ SOLN: 0.2500 mg | INTRAMUSCULAR | Status: DC | PRN

## 2017-04-25 MED ORDER — LORAZEPAM 2 MG/ML IJ SOLN
1.0000 mg | Freq: Once | INTRAMUSCULAR | Status: AC
  Administered 2017-04-25: 1 mg via INTRAVENOUS
  Filled 2017-04-25: qty 1

## 2017-04-25 MED ORDER — DEXAMETHASONE SODIUM PHOSPHATE 10 MG/ML IJ SOLN
INTRAMUSCULAR | Status: DC | PRN
  Administered 2017-04-25: 10 mg via INTRAVENOUS

## 2017-04-25 MED ORDER — PROPOFOL 10 MG/ML IV BOLUS
INTRAVENOUS | Status: AC
  Filled 2017-04-25: qty 20

## 2017-04-25 MED ORDER — SUCCINYLCHOLINE CHLORIDE 200 MG/10ML IV SOSY
PREFILLED_SYRINGE | INTRAVENOUS | Status: DC | PRN
  Administered 2017-04-25: 120 mg via INTRAVENOUS

## 2017-04-25 MED ORDER — MIDAZOLAM HCL 2 MG/2ML IJ SOLN
INTRAMUSCULAR | Status: AC
  Filled 2017-04-25: qty 2

## 2017-04-25 MED ORDER — SODIUM CHLORIDE 0.9 % IV SOLN
2.0000 g | Freq: Once | INTRAVENOUS | Status: AC
  Administered 2017-04-25: 2 g via INTRAVENOUS
  Filled 2017-04-25: qty 20

## 2017-04-25 MED ORDER — SUGAMMADEX SODIUM 200 MG/2ML IV SOLN
INTRAVENOUS | Status: AC
  Filled 2017-04-25: qty 2

## 2017-04-25 MED ORDER — PHENYLEPHRINE 40 MCG/ML (10ML) SYRINGE FOR IV PUSH (FOR BLOOD PRESSURE SUPPORT)
PREFILLED_SYRINGE | INTRAVENOUS | Status: AC
  Filled 2017-04-25: qty 10

## 2017-04-25 MED ORDER — LACTATED RINGERS IV SOLN: INTRAVENOUS | Status: DC

## 2017-04-25 MED ORDER — MIDAZOLAM HCL 5 MG/5ML IJ SOLN
INTRAMUSCULAR | Status: DC | PRN
  Administered 2017-04-25: 2 mg via INTRAVENOUS

## 2017-04-25 MED ORDER — FENTANYL CITRATE (PF) 100 MCG/2ML IJ SOLN
INTRAMUSCULAR | Status: DC | PRN
  Administered 2017-04-25 (×2): 50 ug via INTRAVENOUS
  Administered 2017-04-25: 100 ug via INTRAVENOUS

## 2017-04-25 MED ORDER — BUPIVACAINE-EPINEPHRINE 0.25% -1:200000 IJ SOLN
INTRAMUSCULAR | Status: DC | PRN
  Administered 2017-04-25: 50 mL

## 2017-04-25 MED ORDER — PROPOFOL 10 MG/ML IV BOLUS
INTRAVENOUS | Status: DC | PRN
  Administered 2017-04-25: 150 mg via INTRAVENOUS

## 2017-04-25 MED ORDER — MEPERIDINE HCL 50 MG/ML IJ SOLN: 6.2500 mg | INTRAMUSCULAR | Status: DC | PRN

## 2017-04-25 MED ORDER — MORPHINE SULFATE (PF) 4 MG/ML IV SOLN: 4.0000 mg | INTRAVENOUS | Status: DC | PRN

## 2017-04-25 MED ORDER — SUCCINYLCHOLINE CHLORIDE 200 MG/10ML IV SOSY
PREFILLED_SYRINGE | INTRAVENOUS | Status: AC
  Filled 2017-04-25: qty 10

## 2017-04-25 MED ORDER — LACTATED RINGERS IV SOLN
INTRAVENOUS | Status: DC | PRN
  Administered 2017-04-25 (×2): via INTRAVENOUS

## 2017-04-25 MED ORDER — ONDANSETRON HCL 4 MG/2ML IJ SOLN: 4.0000 mg | Freq: Once | INTRAMUSCULAR | Status: DC | PRN

## 2017-04-25 MED ORDER — IOPAMIDOL (ISOVUE-300) INJECTION 61%
INTRAVENOUS | Status: AC
  Administered 2017-04-25: 100 mL via INTRAVENOUS
  Filled 2017-04-25: qty 30

## 2017-04-25 MED ORDER — ROCURONIUM BROMIDE 50 MG/5ML IV SOSY
PREFILLED_SYRINGE | INTRAVENOUS | Status: DC | PRN
  Administered 2017-04-25: 35 mg via INTRAVENOUS

## 2017-04-25 MED ORDER — BUPIVACAINE-EPINEPHRINE 0.25% -1:200000 IJ SOLN
INTRAMUSCULAR | Status: AC
  Filled 2017-04-25: qty 1

## 2017-04-25 MED ORDER — ONDANSETRON HCL 4 MG/2ML IJ SOLN
INTRAMUSCULAR | Status: DC | PRN
  Administered 2017-04-25: 4 mg via INTRAVENOUS

## 2017-04-25 SURGICAL SUPPLY — 29 items
APPLIER CLIP 5 13 M/L LIGAMAX5 (MISCELLANEOUS)
CABLE HIGH FREQUENCY MONO STRZ (ELECTRODE) IMPLANT
CLIP APPLIE 5 13 M/L LIGAMAX5 (MISCELLANEOUS) IMPLANT
COVER SURGICAL LIGHT HANDLE (MISCELLANEOUS) ×2 IMPLANT
CUTTER FLEX LINEAR 45M (STAPLE) ×2 IMPLANT
DECANTER SPIKE VIAL GLASS SM (MISCELLANEOUS) ×2 IMPLANT
GLOVE BIO SURGEON STRL SZ7.5 (GLOVE) ×2 IMPLANT
GLOVE INDICATOR 8.0 STRL GRN (GLOVE) ×2 IMPLANT
GOWN STRL REUS W/TWL XL LVL3 (GOWN DISPOSABLE) ×4 IMPLANT
GRASPER SUT TROCAR 14GX15 (MISCELLANEOUS) ×2 IMPLANT
IV LACTATED RINGERS 1000ML (IV SOLUTION) ×2 IMPLANT
KIT BASIN OR (CUSTOM PROCEDURE TRAY) ×2 IMPLANT
POUCH RETRIEVAL ECOSAC 10 (ENDOMECHANICALS) ×1 IMPLANT
POUCH RETRIEVAL ECOSAC 10MM (ENDOMECHANICALS) ×1
RELOAD 45 VASCULAR/THIN (ENDOMECHANICALS) ×2 IMPLANT
RELOAD STAPLE TA45 3.5 REG BLU (ENDOMECHANICALS) IMPLANT
SET IRRIG TUBING LAPAROSCOPIC (IRRIGATION / IRRIGATOR) ×2 IMPLANT
SHEARS HARMONIC ACE PLUS 36CM (ENDOMECHANICALS) ×2 IMPLANT
SLEEVE XCEL OPT CAN 5 100 (ENDOMECHANICALS) ×2 IMPLANT
STRIP CLOSURE SKIN 1/2X4 (GAUZE/BANDAGES/DRESSINGS) IMPLANT
SUT MNCRL AB 4-0 PS2 18 (SUTURE) ×2 IMPLANT
SUT VICRYL 0 TIES 12 18 (SUTURE) IMPLANT
SUT VICRYL 0 UR6 27IN ABS (SUTURE) ×2 IMPLANT
TOWEL OR 17X26 10 PK STRL BLUE (TOWEL DISPOSABLE) ×2 IMPLANT
TRAY FOLEY W/METER SILVER 16FR (SET/KITS/TRAYS/PACK) ×2 IMPLANT
TRAY LAPAROSCOPIC (CUSTOM PROCEDURE TRAY) ×2 IMPLANT
TROCAR BLADELESS OPT 5 100 (ENDOMECHANICALS) ×2 IMPLANT
TROCAR XCEL BLUNT TIP 100MML (ENDOMECHANICALS) ×2 IMPLANT
TUBING INSUF HEATED (TUBING) ×2 IMPLANT

## 2017-04-25 NOTE — Telephone Encounter (Signed)
Received auth for pt ct exam auth# [id number] valid through 04/25/2017-05/24/2017 at [facility].

## 2017-04-25 NOTE — Anesthesia Preprocedure Evaluation (Addendum)
Anesthesia Evaluation  Patient identified by MRN, date of birth, ID band Patient awake    Reviewed: Allergy & Precautions, NPO status , Patient's Chart, lab work & pertinent test results  Airway Mallampati: II  TM Distance: >3 FB Neck ROM: Full    Dental  (+) Teeth Intact, Dental Advisory Given   Pulmonary    breath sounds clear to auscultation       Cardiovascular negative cardio ROS   Rhythm:Regular Rate:Normal     Neuro/Psych PSYCHIATRIC DISORDERS Depression    GI/Hepatic negative GI ROS, Neg liver ROS,   Endo/Other  negative endocrine ROS  Renal/GU negative Renal ROS     Musculoskeletal negative musculoskeletal ROS (+)   Abdominal (+) - obese,  Abdomen: tender.    Peds  Hematology negative hematology ROS (+)   Anesthesia Other Findings   Reproductive/Obstetrics                            Anesthesia Physical Anesthesia Plan  ASA: II and emergent  Anesthesia Plan: General   Post-op Pain Management:    Induction: Intravenous  PONV Risk Score and Plan: 4 or greater and Ondansetron, Dexamethasone, Midazolam and Scopolamine patch - Pre-op  Airway Management Planned: Oral ETT  Additional Equipment: None  Intra-op Plan:   Post-operative Plan: Extubation in OR  Informed Consent: I have reviewed the patients History and Physical, chart, labs and discussed the procedure including the risks, benefits and alternatives for the proposed anesthesia with the patient or authorized representative who has indicated his/her understanding and acceptance.   Dental advisory given  Plan Discussed with: CRNA  Anesthesia Plan Comments:         Anesthesia Quick Evaluation

## 2017-04-25 NOTE — Progress Notes (Signed)
Chief Complaint  Patient presents with  . sharp pain on right side    HPI   Pt reports that she woken up by right side sharp pain  She states that the pain is dull until she takes a deep breath then it feels like the pain is like knife She states that she did not have much appetite today and did not go to work today She reports that it is only in the RLQ She still has her appendix No new foods She felt feverish but was sweating in bed  Her pain is 4-5 out of 10 She had a normal bm She ate a few shortbread cookies and lemonade She did not drink any water today    Past Medical History:  Diagnosis Date  . ADHD   . Depression   . Urinary incontinence     Current Outpatient Medications  Medication Sig Dispense Refill  . ALPRAZolam (XANAX) 0.25 MG tablet Take 0.25 mg by mouth at bedtime as needed for anxiety.    Marland Kitchen amphetamine-dextroamphetamine (ADDERALL) 20 MG tablet Take 20 mg by mouth daily.    . Doxepin HCl (SILENOR) 3 MG TABS Take by mouth.    . fluticasone (FLONASE) 50 MCG/ACT nasal spray Place 2 sprays into both nostrils daily. 16 g 6  . REXULTI 1 MG TABS     . VYVANSE 50 MG capsule Take 50 mg by mouth daily.  0   No current facility-administered medications for this visit.    Facility-Administered Medications Ordered in Other Visits  Medication Dose Route Frequency Provider Last Rate Last Dose  . iopamidol (ISOVUE-300) 61 % injection           . iopamidol (ISOVUE-300) 61 % injection             Allergies: No Known Allergies  Past Surgical History:  Procedure Laterality Date  . CHOLECYSTECTOMY      Social History   Socioeconomic History  . Marital status: Single    Spouse name: None  . Number of children: None  . Years of education: None  . Highest education level: None  Social Needs  . Financial resource strain: None  . Food insecurity - worry: None  . Food insecurity - inability: None  . Transportation needs - medical: None  . Transportation needs  - non-medical: None  Occupational History  . None  Tobacco Use  . Smoking status: Never Smoker  . Smokeless tobacco: Never Used  Substance and Sexual Activity  . Alcohol use: Yes    Alcohol/week: 1.2 - 1.8 oz    Types: 2 - 3 Standard drinks or equivalent per week  . Drug use: No  . Sexual activity: No    Partners: Female    Birth control/protection: None  Other Topics Concern  . None  Social History Narrative   Teacher - Teacher, early years/pre    Family History  Problem Relation Age of Onset  . Scoliosis Mother   . Osteoporosis Mother   . Hypertension Father   . Hypertension Maternal Grandmother   . Hodgkin's lymphoma Maternal Grandmother      ROS Review of Systems See HPI Constitution: No fevers or chills No malaise No diaphoresis Skin: No rash or itching Eyes: no blurry vision, no double vision GU: no dysuria or hematuria Neuro: no dizziness or headaches all others reviewed and negative   Objective: Vitals:   04/25/17 1456  BP: 125/84  Pulse: (!) 109  Resp: 16  Temp: 99.8 F (37.7 C)  TempSrc: Oral  SpO2: 97%  Weight: 173 lb 9.6 oz (78.7 kg)  Height: 5' 5"  (1.651 m)    Physical Exam  Constitutional: She is oriented to person, place, and time. She appears well-developed and well-nourished.  HENT:  Head: Normocephalic and atraumatic.  Eyes: Conjunctivae and EOM are normal.  Cardiovascular: Normal rate, regular rhythm and normal heart sounds.  No murmur heard. Pulmonary/Chest: Effort normal and breath sounds normal. No stridor. No respiratory distress. She has no wheezes.  Abdominal: Normal appearance and bowel sounds are normal. There is tenderness in the right lower quadrant. There is tenderness at McBurney's point. There is no rebound, no guarding, no CVA tenderness and negative Murphy's sign.  Neurological: She is alert and oriented to person, place, and time.  Skin: Skin is warm. Capillary refill takes less than 2 seconds.  Psychiatric: She has a  normal mood and affect. Her behavior is normal. Judgment and thought content normal.     Component     Latest Ref Rng & Units 04/25/2017 04/25/2017         3:35 PM  3:35 PM  Color, UA     yellow yellow   Clarity, UA     clear clear   Glucose     negative mg/dL negative   Bilirubin, UA     negative negative negative  Specific Gravity, UA     1.010 - 1.025 1.015   RBC, UA     negative moderate (A)   pH, UA     5.0 - 8.0 5.5   Protein, UA     negative mg/dL negative   Urobilinogen, UA     0.2 or 1.0 E.U./dL 0.2   Nitrite, UA     Negative Negative   Leukocytes, UA     Negative Negative     Assessment and Plan Samantha Barry was seen today for sharp pain on right side.  Diagnoses and all orders for this visit:  Pain of upper abdomen -     POCT urinalysis dipstick -     POCT CBC  RLQ abdominal pain -     POCT CBC -     Comprehensive metabolic panel; Future  Right lower quadrant abdominal pain -     Cancel: CT Abdomen Pelvis W Contrast; Future -     CT Abdomen Pelvis W Contrast; Future  Tachycardia -     Comprehensive metabolic panel; Future  Poor appetite   Concerning for appendicitis Discussed that she should follow up for stat CT abd pelvis Pt without gallbladder after cholecystectomy Discussed that with her tachycardia, poor appetite, localizing symptoms to the RLQ we will evaluate for appendicitis If there is no appendicitis evaluation for diverticular disease or colitis Her wbcs were in the normal range Transferred for evaluation stat   A total of 45 minutes were spent face-to-face with the patient during this encounter and over half of that time was spent on counseling and coordination of care. Time spent to arrange care and review labs  [name]

## 2017-04-25 NOTE — ED Notes (Signed)
Bed: PH43 Expected date:  Expected time:  Means of arrival:  Comments: Hold for appy

## 2017-04-25 NOTE — Telephone Encounter (Signed)
Pt reports right sided abdominal pain, "slightly below umbilicus, more towards back" , does not radiate. Onset last night. Rates at 4-5/10, "mostly dull, worse with movement."  Reports mild nausea, no vomiting. LBM last night; denies any pain/burning with urination, no fever.  States staying hydrated. Appt made by agent for today with Dr. Nolon Rod. Care advice given per protocol.  Reason for Disposition . [1] MODERATE pain (e.g., interferes with normal activities) AND [2] pain comes and goes (cramps) AND [3] present > 24 hours  (Exception: pain with Vomiting or Diarrhea - see that Guideline)  Answer Assessment - Initial Assessment Questions 1. LOCATION: "Where does it hurt?"      Right side, towards back, "Slightly below umbilicus." 2. RADIATION: "Does the pain shoot anywhere else?" (e.g., chest, back)     No 3. ONSET: "When did the pain begin?" (e.g., minutes, hours or days ago)      "Last night" 4. SUDDEN: "Gradual or sudden onset?"     Sudden 5. PATTERN "Does the pain come and go, or is it constant?"    - If constant: "Is it getting better, staying the same, or worsening?"      (Note: Constant means the pain never goes away completely; most serious pain is constant and it progresses)     - If intermittent: "How long does it last?" "Do you have pain now?"     (Note: Intermittent means the pain goes away completely between bouts)     WIth movement and breathing. 6. SEVERITY: "How bad is the pain?"  (e.g., Scale 1-10; mild, moderate, or severe)   - MILD (1-3): doesn't interfere with normal activities, abdomen soft and not tender to touch    - MODERATE (4-7): interferes with normal activities or awakens from sleep, tender to touch    - SEVERE (8-10): excruciating pain, doubled over, unable to do any normal activities      4-5/10 7. RECURRENT SYMPTOM: "Have you ever had this type of abdominal pain before?" If so, ask: "When was the last time?" and "What happened that time?"      No 8. CAUSE:  "What do you think is causing the abdominal pain?"     No 9. RELIEVING/AGGRAVATING FACTORS: "What makes it better or worse?" (e.g., movement, antacids, bowel movement)     Rest 10. OTHER SYMPTOMS: "Has there been any vomiting, diarrhea, constipation, or urine problems?"      "mild" nausea 11. PREGNANCY: "Is there any chance you are pregnant?" "When was your l     No  Protocols used: ABDOMINAL PAIN - [facility]

## 2017-04-25 NOTE — Patient Instructions (Addendum)
  I am concerned about appendicitis I have set up a stat CT abdomen and Pelvis today at the following location    IF you received an x-ray today, you will receive an invoice from [facility]. Please contact [facility] at [phone] with questions or concerns regarding your invoice.   IF you received labwork today, you will receive an invoice from [facility]. Please contact [facility] at [phone] with questions or concerns regarding your invoice.   Our billing staff will not be able to assist you with questions regarding bills from these companies.  You will be contacted with the lab results as soon as they are available. The fastest way to get your results is to activate your My Chart account. Instructions are located on the last page of this paperwork. If you have not heard from [place] regarding the results in 2 weeks, please contact this office.     Abdominal Pain, Adult Abdominal pain can be caused by many things. Often, abdominal pain is not serious and it gets better with no treatment or by being treated at home. However, sometimes abdominal pain is serious. Your health care provider will do a medical history and a physical exam to try to determine the cause of your abdominal pain. Follow these instructions at home:  Take over-the-counter and prescription medicines only as told by your health care provider. Do not take a laxative unless told by your health care provider.  Drink enough fluid to keep your urine clear or pale yellow.  Watch your condition for any changes.  Keep all follow-up visits as told by your health care provider. This is important. Contact a health care provider if:  Your abdominal pain changes or gets worse.  You are not hungry or you lose weight without trying.  You are constipated or have diarrhea for more than 2-3 days.  You have pain when you urinate or have a bowel movement.  Your abdominal pain wakes you up at night.  Your pain  gets worse with meals, after eating, or with certain foods.  You are throwing up and cannot keep anything down.  You have a fever. Get help right away if:  Your pain does not go away as soon as your health care provider told you to expect.  You cannot stop throwing up.  Your pain is only in areas of the abdomen, such as the right side or the left lower portion of the abdomen.  You have bloody or black stools, or stools that look like tar.  You have severe pain, cramping, or bloating in your abdomen.  You have signs of dehydration, such as: ? Dark urine, very little urine, or no urine. ? Cracked lips. ? Dry mouth. ? Sunken eyes. ? Sleepiness. ? Weakness. This information is not intended to replace advice given to you by your health care provider. Make sure you discuss any questions you have with your health care provider. Document Released: 12/02/2004 Document Revised: 09/12/2015 Document Reviewed: 08/06/2015 Elsevier Interactive Patient Education  [employer].

## 2017-04-25 NOTE — ED Notes (Signed)
Transferred to OR via stretcher

## 2017-04-25 NOTE — ED Provider Notes (Signed)
[facility] DEPT Provider Note   CSN: [id number] Arrival date & time: 04/25/17  2023     History   Chief Complaint Chief Complaint  Patient presents with  . Abdominal Pain    HPI Samantha Barry is a 52 y.o. female.  HPI   51yf with abdominal pain. Onset yesterday evening. RUQ to R flank and progressing more to umbilicus and RLQ since then. Seen in UC and CT s/p ordered. Significant for appy w/o perf or abscess. Last ate around [place] today. Not on blood thinners. Surgical hx significant for cholecystitis.   Past Medical History:  Diagnosis Date  . ADHD   . Depression   . Urinary incontinence     Patient Active Problem List   Diagnosis Date Noted  . Severe single current episode of major depressive disorder, without psychotic features ([place]) 03/17/2016  . Menopausal and perimenopausal disorder 03/17/2016    Past Surgical History:  Procedure Laterality Date  . CHOLECYSTECTOMY      OB History    Gravida Para Term Preterm AB Living   0 0 0 0 0 0   SAB TAB Ectopic Multiple Live Births   0 0 0 0 0       Home Medications    Prior to Admission medications   Medication Sig Start Date End Date Taking? Authorizing Provider  ALPRAZolam Duanne Moron) 0.25 MG tablet Take 0.25 mg by mouth at bedtime as needed for anxiety.    [provider]  amphetamine-dextroamphetamine (ADDERALL) 20 MG tablet Take 20 mg by mouth daily.    [provider]  Doxepin HCl (SILENOR) 3 MG TABS Take by mouth.    [provider]  fluticasone (FLONASE) 50 MCG/ACT nasal spray Place 2 sprays into both nostrils daily. 03/17/16   Forrest Moron, MD  REXULTI 1 MG TABS  03/27/16   [provider]  VYVANSE 50 MG capsule Take 50 mg by mouth daily. 01/20/16   [provider]    Family History Family History  Problem Relation Age of Onset  . Scoliosis Mother   . Osteoporosis Mother   . Hypertension Father   . Hypertension Maternal  Grandmother   . Hodgkin's lymphoma Maternal Grandmother     Social History Social History   Tobacco Use  . Smoking status: Never Smoker  . Smokeless tobacco: Never Used  Substance Use Topics  . Alcohol use: Yes    Alcohol/week: 1.2 - 1.8 oz    Types: 2 - 3 Standard drinks or equivalent per week  . Drug use: No     Allergies   Patient has no known allergies.   Review of Systems Review of Systems  All systems reviewed and negative, other than as noted in HPI.  Physical Exam Updated Vital Signs BP 119/82 (BP Location: Left Arm)   Pulse 96   Temp 97.9 F (36.6 C) (Oral)   Resp 16   SpO2 96%   Physical Exam  Constitutional: She appears well-developed and well-nourished. No distress.  HENT:  Head: Normocephalic and atraumatic.  Eyes: Conjunctivae are normal. Right eye exhibits no discharge. Left eye exhibits no discharge.  Neck: Neck supple.  Cardiovascular: Normal rate, regular rhythm and normal heart sounds. Exam reveals no gallop and no friction rub.  No murmur heard. Pulmonary/Chest: Effort normal and breath sounds normal. No respiratory distress.  Abdominal: Soft. She exhibits no distension. There is tenderness.  Pubic and right lower quadrant tenderness without rebound or guarding.  Musculoskeletal: She exhibits  no edema or tenderness.  Neurological: She is alert.  Skin: Skin is warm and dry.  Psychiatric: She has a normal mood and affect. Her behavior is normal. Thought content normal.  Nursing note and vitals reviewed.    ED Treatments / Results  Labs (all labs ordered are listed, but only abnormal results are displayed) Labs Reviewed  BASIC METABOLIC PANEL - Abnormal; Notable for the following components:      Result Value   Glucose, Bld 160 (*)    Calcium 8.8 (*)    All other components within normal limits  CBC - Abnormal; Notable for the following components:   Hemoglobin 11.4 (*)    HCT 34.5 (*)    Platelets 143 (*)    All other components  within normal limits  BASIC METABOLIC PANEL - Abnormal; Notable for the following components:   Glucose, Bld 198 (*)    Calcium 8.8 (*)    All other components within normal limits  SURGICAL PATHOLOGY    EKG  EKG Interpretation None       Radiology Ct Abdomen Pelvis W Contrast  Result Date: 04/25/2017 CLINICAL DATA:  Right lower quadrant abdominal pain since last night. Prior cholecystectomy. EXAM: CT ABDOMEN AND PELVIS WITH CONTRAST TECHNIQUE: Multidetector CT imaging of the abdomen and pelvis was performed using the standard protocol following bolus administration of intravenous contrast. CONTRAST:  41m ISOVUE-300 IOPAMIDOL (ISOVUE-300) INJECTION 61%, 1024mISOVUE-300 IOPAMIDOL (ISOVUE-300) INJECTION 61% COMPARISON:  None. FINDINGS: Lower chest: No significant pulmonary nodules or acute consolidative airspace disease. Hepatobiliary: Normal liver size. No liver mass. Cholecystectomy. No biliary ductal dilatation. Pancreas: Normal, with no mass or duct dilation. Spleen: Normal size. No mass. Adrenals/Urinary Tract: Normal adrenals. Normal kidneys with no hydronephrosis and no renal mass. Normal bladder. Stomach/Bowel: Small hiatal hernia. Otherwise normal nondistended stomach. Normal caliber small bowel with no small bowel wall thickening. The appendix is dilated and thick walled with prominent periappendiceal fat stranding, compatible with acute appendicitis. Appendix: Location: Right lower quadrant Diameter: 11 mm Appendicolith: Not present Mucosal hyper-enhancement: Present Extraluminal gas: Not present Periappendiceal collection: Not present Mild wall thickening in the cecum is probably reactive. Transverse and left colon are relatively collapsed. Oral contrast reaches the hepatic flexure. Vascular/Lymphatic: Normal caliber abdominal aorta. Patent portal, splenic, hepatic and renal veins. Mildly enlarged right lower quadrant mesenteric nodes, largest 1.2 cm (series 2/image 50). Reproductive:  Uterus is heterogeneous, which may indicate small fibroids. No adnexal masses. Other: No pneumoperitoneum, ascites or focal fluid collection. Musculoskeletal: No aggressive appearing focal osseous lesions. Non expansile sclerotic anterior right seventh rib lesion, nonspecific, probably a benign bone island. Moderate lumbar spondylosis. IMPRESSION: 1. Acute appendicitis. No free air. No abscess. No appendicoliths. 2. Mild cecal wall thickening, probably reactive. 3. Mild right lower quadrant mesenteric adenopathy, probably reactive. 4. Small hiatal hernia. 5. Heterogeneous uterus, nonspecific, which may indicate small fibroids. Outpatient pelvic sonography correlation may be obtained as clinically warranted. These results were called by telephone at the time of interpretation on 04/25/2017 at 7:40 pm to Dr. ZODelia Chimes who verbally acknowledged these results. Electronically Signed   By: JaIlona Sorrel.D.   On: 04/25/2017 19:43    Procedures Procedures (including critical care time)  Medications Ordered in ED Medications  cefTRIAXone (ROCEPHIN) 2 g in sodium chloride 0.9 % 100 mL IVPB (not administered)  sodium chloride 0.9 % bolus 1,000 mL (not administered)  LORazepam (ATIVAN) injection 1 mg (not administered)  morphine 4 MG/ML injection 4 mg (not administered)  ondansetron (ZOFRAN) injection 4 mg (not administered)     Initial Impression / Assessment and Plan / ED Course  I have reviewed the triage vital signs and the nursing notes.  Pertinent labs & imaging results that were available during my care of the patient were reviewed by me and considered in my medical decision making (see chart for details).     [place]. Abx. Surgery consult. Admit.   Final Clinical Impressions(s) / ED Diagnoses   Final diagnoses:  Other acute appendicitis    ED Discharge Orders    None       Virgel Manifold, MD 04/26/17 1623

## 2017-04-25 NOTE — H&P (Signed)
Samantha Barry is an 52 y.o. female.   Chief Complaint: abdominal pain HPI: 52 year old female developed right-sided abdominal pain Sunday evening.  She states the pain was constant, sharp.  She was able to get some sleep but did not feel like going to work today.  The pain was persistent this morning.  She contacted her primary care and was able to get an appointment late this afternoon.  She states that she had a temperature of around 99 last night.  She was mildly tachycardic in the physician's office.  And given her right lower quadrant pain there was concern for appendicitis.  CBC was checked and the patient was scheduled for urgent CT scan this evening.  The patient's CT showed acute appendicitis  She denies any similar prior symptoms.  She denies any nausea, vomiting, diarrhea or constipation.  Last bowel movement was this morning.  She denies any melena or hematochezia.  She denies any shortness of breath or chest pain.  She does not smoke.  Past Medical History:  Diagnosis Date  . ADHD   . Depression   . Urinary incontinence     Past Surgical History:  Procedure Laterality Date  . CHOLECYSTECTOMY      Family History  Problem Relation Age of Onset  . Scoliosis Mother   . Osteoporosis Mother   . Hypertension Father   . Hypertension Maternal Grandmother   . Hodgkin's lymphoma Maternal Grandmother    Social History:  reports that  has never smoked. she has never used smokeless tobacco. She reports that she drinks about 1.2 - 1.8 oz of alcohol per week. She reports that she does not use drugs.  Allergies: No Known Allergies   (Not in a hospital admission)  Results for orders placed or performed during the hospital encounter of 04/25/17 (from the past 48 hour(s))  Basic metabolic panel     Status: Abnormal   Collection Time: 04/25/17  9:14 PM  Result Value Ref Range   Sodium 136 135 - 145 mmol/L   Potassium 3.9 3.5 - 5.1 mmol/L   Chloride 104 101 - 111 mmol/L   CO2 24 22 - 32  mmol/L   Glucose, Bld 160 (H) 65 - 99 mg/dL   BUN 7 6 - 20 mg/dL   Creatinine, Ser 0.88 0.44 - 1.00 mg/dL   Calcium 8.8 (L) 8.9 - 10.3 mg/dL   GFR calc non Af Amer >60 >60 mL/min   GFR calc Af Amer >60 >60 mL/min    Comment: (NOTE) The eGFR has been calculated using the CKD EPI equation. This calculation has not been validated in all clinical situations. eGFR's persistently <60 mL/min signify possible Chronic Kidney Disease.    Anion gap 8 5 - 15    Comment: Performed at [facility], [place] [address], [place], [place] [address]   Ct Abdomen Pelvis W Contrast  Result Date: 04/25/2017 CLINICAL DATA:  Right lower quadrant abdominal pain since last night. Prior cholecystectomy. EXAM: CT ABDOMEN AND PELVIS WITH CONTRAST TECHNIQUE: Multidetector CT imaging of the abdomen and pelvis was performed using the standard protocol following bolus administration of intravenous contrast. CONTRAST:  1m ISOVUE-300 IOPAMIDOL (ISOVUE-300) INJECTION 61%, 1016mISOVUE-300 IOPAMIDOL (ISOVUE-300) INJECTION 61% COMPARISON:  None. FINDINGS: Lower chest: No significant pulmonary nodules or acute consolidative airspace disease. Hepatobiliary: Normal liver size. No liver mass. Cholecystectomy. No biliary ductal dilatation. Pancreas: Normal, with no mass or duct dilation. Spleen: Normal size. No mass. Adrenals/Urinary Tract: Normal adrenals. Normal kidneys with no  hydronephrosis and no renal mass. Normal bladder. Stomach/Bowel: Small hiatal hernia. Otherwise normal nondistended stomach. Normal caliber small bowel with no small bowel wall thickening. The appendix is dilated and thick walled with prominent periappendiceal fat stranding, compatible with acute appendicitis. Appendix: Location: Right lower quadrant Diameter: 11 mm Appendicolith: Not present Mucosal hyper-enhancement: Present Extraluminal gas: Not present Periappendiceal collection: Not present Mild wall thickening in the cecum is probably  reactive. Transverse and left colon are relatively collapsed. Oral contrast reaches the hepatic flexure. Vascular/Lymphatic: Normal caliber abdominal aorta. Patent portal, splenic, hepatic and renal veins. Mildly enlarged right lower quadrant mesenteric nodes, largest 1.2 cm (series 2/image 50). Reproductive: Uterus is heterogeneous, which may indicate small fibroids. No adnexal masses. Other: No pneumoperitoneum, ascites or focal fluid collection. Musculoskeletal: No aggressive appearing focal osseous lesions. Non expansile sclerotic anterior right seventh rib lesion, nonspecific, probably a benign bone island. Moderate lumbar spondylosis. IMPRESSION: 1. Acute appendicitis. No free air. No abscess. No appendicoliths. 2. Mild cecal wall thickening, probably reactive. 3. Mild right lower quadrant mesenteric adenopathy, probably reactive. 4. Small hiatal hernia. 5. Heterogeneous uterus, nonspecific, which may indicate small fibroids. Outpatient pelvic sonography correlation may be obtained as clinically warranted. These results were called by telephone at the time of interpretation on 04/25/2017 at 7:40 pm to Dr. Delia Chimes , who verbally acknowledged these results. Electronically Signed   By: Ilona Sorrel M.D.   On: 04/25/2017 19:43    Review of Systems  Constitutional: Negative for weight loss.  HENT: Negative for nosebleeds.   Eyes: Negative for blurred vision.  Respiratory: Negative for shortness of breath.   Cardiovascular: Negative for chest pain, palpitations, orthopnea and PND.       Denies DOE  Gastrointestinal: Positive for abdominal pain. Negative for blood in stool, constipation, diarrhea, nausea and vomiting.  Genitourinary: Negative for dysuria, frequency, hematuria and urgency.  Musculoskeletal: Negative.   Skin: Negative for itching and rash.  Neurological: Negative for dizziness, focal weakness, seizures, loss of consciousness and headaches.       Denies TIAs, amaurosis fugax    Endo/Heme/Allergies: Does not bruise/bleed easily.  Psychiatric/Behavioral: The patient is not nervous/anxious.     Blood pressure 119/82, pulse 96, temperature 97.9 F (36.6 C), temperature source Oral, resp. rate 16, SpO2 96 %. Physical Exam  Vitals reviewed. Constitutional: She is oriented to person, place, and time. She appears well-developed and well-nourished. No distress.  Overweight, resting comfortably  HENT:  Head: Normocephalic and atraumatic.  Right Ear: External ear normal.  Left Ear: External ear normal.  Eyes: Conjunctivae are normal. No scleral icterus.  Neck: Normal range of motion. Neck supple. No tracheal deviation present. No thyromegaly present.  Cardiovascular: Normal rate and normal heart sounds.  Respiratory: Effort normal and breath sounds normal. No stridor. No respiratory distress. She has no wheezes.  GI: Soft. She exhibits no distension. There is tenderness in the right lower quadrant. There is no rigidity, no rebound and no guarding.    Old trocar scars; +RLQ TTP, no peritonitis  Musculoskeletal: She exhibits no edema or tenderness.  Lymphadenopathy:    She has no cervical adenopathy.  Neurological: She is alert and oriented to person, place, and time. She exhibits normal muscle tone.  Skin: Skin is warm and dry. No rash noted. She is not diaphoretic. No erythema. No pallor.  Psychiatric: She has a normal mood and affect. Her behavior is normal. Judgment and thought content normal.     Assessment/Plan Acute appendicitis  We discussed the  etiology and management of acute appendicitis. We discussed operative and nonoperative management.  I recommended operative management along with IV antibiotics.  We discussed laparoscopic appendectomy. We discussed the risk and benefits of surgery including but not limited to bleeding, infection, injury to surrounding structures, need to convert to an open procedure, blood clot formation, post operative abscess or  wound infection, staple line complications such as leak or bleeding, hernia formation, post operative ileus, need for additional procedures, anesthesia complications, and the typical postoperative course. I explained that the patient should expect a good improvement in their symptoms.  IV abx on call SCDs  Greer Pickerel, MD 04/25/2017, 10:00 PM

## 2017-04-25 NOTE — ED Triage Notes (Signed)
Pt states she started having pain in her right lower quadrant yesterday evening  Pt went to her PCP today and was sent for a CT scan  PCP called her back this evening and told her to come to the hospital for acute appendicitis  Pt states she ate supper tonight at [employer]

## 2017-04-25 NOTE — Op Note (Signed)
Samantha Barry [id number] 06/05/1965 04/25/2017  Appendectomy, Lap, Procedure Note  Indications: The patient presented with a history of right-sided abdominal pain. A CT revealed findings consistent with acute appendicitis.  Pre-operative Diagnosis: Acute appendicitis without mention of peritonitis  Post-operative Diagnosis: Same  Surgeon: Greer Pickerel MD FACS  Assistants: none  Anesthesia: General endotracheal anesthesia  ASA Class: 2, E  Procedure Details  The patient was seen again in the Holding Room. The risks, benefits, complications, treatment options, and expected outcomes were discussed with the patient and/or family. The possibilities of perforation of viscus, bleeding, recurrent infection, the need for additional procedures, failure to diagnose a condition, and creating a complication requiring transfusion or operation were discussed. There was concurrence with the proposed plan and informed consent was obtained. The site of surgery was properly noted. The patient was taken to Operating Room, identified as Samantha Barry and the procedure verified as Appendectomy. A Time Out was held and the above information confirmed.  The patient was placed in the supine position and general anesthesia was induced, along with placement of orogastric tube, SCDs, and a Foley catheter. The abdomen was prepped and draped in a sterile fashion. A 1.5 centimeter infraumbilical incision was made.  The umbilical stalk was elevated, and the midline fascia was incised with a #11 blade.  A Kelly clamp was used to confirm entrance into the peritoneal cavity.  A pursestring suture was passed around the incision with a 0 Vicryl.  A 79m Hasson was introduced into the abdomen and the tails of the suture were used to hold the Hasson in place.   The pneumoperitoneum was then established to steady pressure of 15 mmHg.  Additional 5 mm cannulas then placed in the left lower quadrant of the abdomen and the suprapubic  region under direct visualization. A careful evaluation of the entire abdomen was carried out. The patient was placed in Trendelenburg and left lateral decubitus position. The small intestines were retracted in the cephalad and left lateral direction away from the pelvis and right lower quadrant. The patient was found to have an inflammed appendix that was mainly in the RLQ. There was no evidence of perforation. There was a fair amount of 'soft' inflammation in the mesoappendix with edema.   Based on the anatomy, I decided to take the divide the appendix at the junction of its base with the cecum. The appendix was divided at its base using an endo-GIA stapler with a white load. No appendiceal stump was left in place.  The appendix was carefully dissected. The appendix was was skeletonized with the harmonic scalpel.   The appendix was removed from the abdomen with an Ecco bag through the umbilical port.  There was no evidence of bleeding, leakage, or complication after division of the appendix. Irrigation was also performed and irrigate suctioned from the abdomen as well.  The umbilical port site was closed with the purse string suture. The closure was viewed laparoscopically. There was no residual palpable fascial defect but there was an air leak so I placed an additional interrupted 0 vicryl suture with a PMI suture passer using laparoscopic guidance. Local was infiltrated in bilateral lateral abdominal walls as a TAP block and at the umbilicus.  The trocar site skin wounds were closed with 4-0 Monocryl. Dermabond was applied to the skin incisions.  Instrument, sponge, and needle counts were correct at the conclusion of the case.   Findings: The appendix was found to be inflamed. There were not signs of necrosis.  There was not perforation. There was not abscess formation.  Estimated Blood Loss:  Minimal         Drains: none         Specimens: appendix         Complications:  None; patient  tolerated the procedure well.         Disposition: PACU - hemodynamically stable.         Condition: stable  Leighton Ruff. Redmond Pulling, MD, FACS General, Bariatric, & Minimally Invasive Surgery [facility], [place]

## 2017-04-25 NOTE — Anesthesia Procedure Notes (Signed)
Procedure Name: Intubation Date/Time: 04/25/2017 10:49 PM Performed by: Montel Clock, CRNA Pre-anesthesia Checklist: Patient identified, Emergency Drugs available, Suction available, Patient being monitored and Timeout performed Patient Re-evaluated:Patient Re-evaluated prior to induction Oxygen Delivery Method: Circle system utilized Preoxygenation: Pre-oxygenation with 100% oxygen Induction Type: IV induction, Rapid sequence and Cricoid Pressure applied Laryngoscope Size: Mac and 3 Grade View: Grade II Tube type: Oral Tube size: 7.0 mm Number of attempts: 1 Airway Equipment and Method: Stylet Placement Confirmation: ETT inserted through vocal cords under direct vision,  positive ETCO2 and breath sounds checked- equal and bilateral Secured at: 21 cm Tube secured with: Tape Dental Injury: Teeth and Oropharynx as per pre-operative assessment

## 2017-04-26 ENCOUNTER — Encounter (HOSPITAL_COMMUNITY): Payer: Self-pay | Admitting: General Surgery

## 2017-04-26 LAB — BASIC METABOLIC PANEL
ANION GAP: 9 (ref 5–15)
BUN: 9 mg/dL (ref 6–20)
CHLORIDE: 106 mmol/L (ref 101–111)
CO2: 22 mmol/L (ref 22–32)
CREATININE: 0.75 mg/dL (ref 0.44–1.00)
Calcium: 8.8 mg/dL — ABNORMAL LOW (ref 8.9–10.3)
GFR calc non Af Amer: >60 mL/min (ref >60)
Glucose, Bld: 198 mg/dL — ABNORMAL HIGH (ref 65–99)
Potassium: 4.2 mmol/L (ref 3.5–5.1)
Sodium: 137 mmol/L (ref 135–145)

## 2017-04-26 LAB — CBC
HEMATOCRIT: 34.5 % — AB (ref 36.0–46.0)
HEMOGLOBIN: 11.4 g/dL — AB (ref 12.0–15.0)
MCH: 28.1 pg (ref 26.0–34.0)
MCHC: 33 g/dL (ref 30.0–36.0)
MCV: 85.2 fL (ref 78.0–100.0)
Platelets: 143 10*3/uL — ABNORMAL LOW (ref 150–400)
RBC: 4.05 MIL/uL (ref 3.87–5.11)
RDW: 13.7 % (ref 11.5–15.5)
WBC: 10.5 10*3/uL (ref 4.0–10.5)

## 2017-04-26 MED ORDER — HYDROCODONE-ACETAMINOPHEN 5-325 MG PO TABS: 1.0000 | ORAL_TABLET | ORAL | 0 refills | Status: DC | PRN

## 2017-04-26 MED ORDER — SIMETHICONE 80 MG PO CHEW: 40.0000 mg | CHEWABLE_TABLET | Freq: Four times a day (QID) | ORAL | Status: DC | PRN

## 2017-04-26 MED ORDER — KETOROLAC TROMETHAMINE 30 MG/ML IJ SOLN
30.0000 mg | Freq: Four times a day (QID) | INTRAMUSCULAR | Status: DC
  Administered 2017-04-26 (×2): 30 mg via INTRAVENOUS
  Filled 2017-04-26 (×3): qty 1

## 2017-04-26 MED ORDER — FLUTICASONE PROPIONATE 50 MCG/ACT NA SUSP
2.0000 | Freq: Every day | NASAL | Status: DC
  Administered 2017-04-26: 2 via NASAL
  Filled 2017-04-26: qty 16

## 2017-04-26 MED ORDER — PANTOPRAZOLE SODIUM 40 MG PO TBEC: 40.0000 mg | DELAYED_RELEASE_TABLET | Freq: Every day | ORAL | Status: DC

## 2017-04-26 MED ORDER — PROMETHAZINE HCL 25 MG/ML IJ SOLN: 12.5000 mg | Freq: Four times a day (QID) | INTRAMUSCULAR | Status: DC | PRN

## 2017-04-26 MED ORDER — ACETAMINOPHEN 500 MG PO TABS
1000.0000 mg | ORAL_TABLET | Freq: Four times a day (QID) | ORAL | Status: DC
  Administered 2017-04-26: 1000 mg via ORAL
  Filled 2017-04-26: qty 2

## 2017-04-26 MED ORDER — ACETAMINOPHEN 325 MG PO TABS: ORAL_TABLET | ORAL | Status: AC

## 2017-04-26 MED ORDER — SUGAMMADEX SODIUM 200 MG/2ML IV SOLN
INTRAVENOUS | Status: DC | PRN
  Administered 2017-04-25: 200 mg via INTRAVENOUS

## 2017-04-26 MED ORDER — HYDROCODONE-ACETAMINOPHEN 5-325 MG PO TABS: 1.0000 | ORAL_TABLET | ORAL | Status: DC | PRN

## 2017-04-26 MED ORDER — PANTOPRAZOLE SODIUM 40 MG IV SOLR
40.0000 mg | Freq: Every day | INTRAVENOUS | Status: DC
  Administered 2017-04-26: 40 mg via INTRAVENOUS
  Filled 2017-04-26: qty 40

## 2017-04-26 MED ORDER — ENOXAPARIN SODIUM 40 MG/0.4ML ~~LOC~~ SOLN
40.0000 mg | Freq: Every day | SUBCUTANEOUS | Status: DC
  Administered 2017-04-26: 40 mg via SUBCUTANEOUS
  Filled 2017-04-26: qty 0.4

## 2017-04-26 MED ORDER — DOCUSATE SODIUM 100 MG PO CAPS
100.0000 mg | ORAL_CAPSULE | Freq: Two times a day (BID) | ORAL | Status: DC
  Administered 2017-04-26: 100 mg via ORAL
  Filled 2017-04-26: qty 1

## 2017-04-26 MED ORDER — GABAPENTIN 300 MG PO CAPS
300.0000 mg | ORAL_CAPSULE | Freq: Two times a day (BID) | ORAL | Status: DC
  Administered 2017-04-26: 300 mg via ORAL
  Filled 2017-04-26: qty 1

## 2017-04-26 MED ORDER — ONDANSETRON 4 MG PO TBDP: 4.0000 mg | ORAL_TABLET | Freq: Four times a day (QID) | ORAL | Status: DC | PRN

## 2017-04-26 MED ORDER — DIPHENHYDRAMINE HCL 50 MG/ML IJ SOLN: 12.5000 mg | Freq: Four times a day (QID) | INTRAMUSCULAR | Status: DC | PRN

## 2017-04-26 MED ORDER — IBUPROFEN 200 MG PO TABS: ORAL_TABLET | ORAL | Status: DC

## 2017-04-26 MED ORDER — KCL IN DEXTROSE-NACL 20-5-0.45 MEQ/L-%-% IV SOLN
INTRAVENOUS | Status: DC
  Administered 2017-04-26: 02:00:00 via INTRAVENOUS
  Filled 2017-04-26: qty 1000

## 2017-04-26 MED ORDER — ONDANSETRON HCL 4 MG/2ML IJ SOLN: 4.0000 mg | Freq: Four times a day (QID) | INTRAMUSCULAR | Status: DC | PRN

## 2017-04-26 MED ORDER — DIPHENHYDRAMINE HCL 12.5 MG/5ML PO ELIX: 12.5000 mg | ORAL_SOLUTION | Freq: Four times a day (QID) | ORAL | Status: DC | PRN

## 2017-04-26 MED ORDER — OXYCODONE HCL 5 MG PO TABS: 5.0000 mg | ORAL_TABLET | ORAL | Status: DC | PRN

## 2017-04-26 MED ORDER — ESCITALOPRAM OXALATE 20 MG PO TABS
20.0000 mg | ORAL_TABLET | Freq: Every day | ORAL | Status: DC
  Administered 2017-04-26: 20 mg via ORAL
  Filled 2017-04-26: qty 1

## 2017-04-26 MED ORDER — MORPHINE SULFATE (PF) 2 MG/ML IV SOLN: 1.0000 mg | INTRAVENOUS | Status: DC | PRN

## 2017-04-26 NOTE — Anesthesia Postprocedure Evaluation (Signed)
Anesthesia Post Note  Patient: Samantha Barry  Procedure(s) Performed: APPENDECTOMY LAPAROSCOPIC (N/A Abdomen)     Patient location during evaluation: PACU Anesthesia Type: General Level of consciousness: awake and alert Pain management: pain level controlled Vital Signs Assessment: post-procedure vital signs reviewed and stable Respiratory status: spontaneous breathing, nonlabored ventilation, respiratory function stable and patient connected to nasal cannula oxygen Cardiovascular status: blood pressure returned to baseline and stable Postop Assessment: no apparent nausea or vomiting Anesthetic complications: no    Last Vitals:  Vitals:   04/26/17 0751 04/26/17 1403  BP: 127/75 110/66  Pulse: 85 89  Resp: 16 18  Temp: 36.7 C 36.6 C  SpO2: 98% 97%    Last Pain:  Vitals:   04/26/17 1403  TempSrc: Oral  PainSc: 0-No pain                 Effie Berkshire

## 2017-04-26 NOTE — Progress Notes (Signed)
Discharge and medication instructions reviewed with patient; questions answered and patient denies further questions. One prescription and a work note given to patient. Her family is here to drive her home. Donne Hazel, RN

## 2017-04-26 NOTE — Progress Notes (Signed)
The patient is receiving Protonix by the intravenous route.  Based on criteria approved by the Pharmacy and [place], the medication is being converted to the equivalent oral dose form.  These criteria include: -No active GI bleeding -Able to tolerate diet of full liquids (or better) or tube feeding -Able to tolerate other medications by the oral or enteral route  If you have any questions about this conversion, please contact the Pharmacy Department (phone [phone]).  Thank you.  Minda Ditto PharmD Pager [phone] 04/26/2017, 10:59 AM

## 2017-04-26 NOTE — Progress Notes (Signed)
1 Day Post-Op lap appy  Subjective: Feels better this am.  Pain controlled. Tolerating clears.  Objective: Vital signs in last 24 hours: Temp:  [97.6 F (36.4 C)-99.8 F (37.7 C)] 98 F (36.7 C) (02/19 0751) Pulse Rate:  [84-113] 85 (02/19 0751) Resp:  [16-19] 16 (02/19 0751) BP: (105-130)/(70-88) 127/75 (02/19 0751) SpO2:  [96 %-100 %] 98 % (02/19 0751) Weight:  [78.7 kg (173 lb 8 oz)-78.7 kg (173 lb 9.6 oz)] 78.7 kg (173 lb 8 oz) (02/19 0141)   Intake/Output from previous day: 02/18 0701 - 02/19 0700 In: 1325 [I.V.:1325] Out: 305 [Urine:300; Blood:5] Intake/Output this shift: Total I/O In: 180 [P.O.:180] Out: -    General appearance: alert and cooperative GI: normal findings: soft, apropriately tender, non-distended  Incision: no significant drainage  Lab Results:  Recent Labs    04/25/17 1648 04/26/17 0442  WBC 8.7 10.5  HGB 12.3 11.4*  HCT 38.3 34.5*  PLT  --  143*   BMET Recent Labs    04/25/17 2114 04/26/17 0442  NA 136 137  K 3.9 4.2  CL 104 106  CO2 24 22  GLUCOSE 160* 198*  BUN 7 9  CREATININE 0.88 0.75  CALCIUM 8.8* 8.8*   PT/INR No results for input(s): LABPROT, INR in the last 72 hours. ABG No results for input(s): PHART, HCO3 in the last 72 hours.  Invalid input(s): PCO2, PO2  MEDS, Scheduled . docusate sodium  100 mg Oral BID  . enoxaparin (LOVENOX) injection  40 mg Subcutaneous Daily  . escitalopram  20 mg Oral Daily  . fluticasone  2 spray Each Nare Daily  . gabapentin  300 mg Oral BID  . ketorolac  30 mg Intravenous Q6H  . pantoprazole (PROTONIX) IV  40 mg Intravenous QHS    Studies/Results: Ct Abdomen Pelvis W Contrast  Result Date: 04/25/2017 CLINICAL DATA:  Right lower quadrant abdominal pain since last night. Prior cholecystectomy. EXAM: CT ABDOMEN AND PELVIS WITH CONTRAST TECHNIQUE: Multidetector CT imaging of the abdomen and pelvis was performed using the standard protocol following bolus administration of intravenous  contrast. CONTRAST:  22m ISOVUE-300 IOPAMIDOL (ISOVUE-300) INJECTION 61%, 1072mISOVUE-300 IOPAMIDOL (ISOVUE-300) INJECTION 61% COMPARISON:  None. FINDINGS: Lower chest: No significant pulmonary nodules or acute consolidative airspace disease. Hepatobiliary: Normal liver size. No liver mass. Cholecystectomy. No biliary ductal dilatation. Pancreas: Normal, with no mass or duct dilation. Spleen: Normal size. No mass. Adrenals/Urinary Tract: Normal adrenals. Normal kidneys with no hydronephrosis and no renal mass. Normal bladder. Stomach/Bowel: Small hiatal hernia. Otherwise normal nondistended stomach. Normal caliber small bowel with no small bowel wall thickening. The appendix is dilated and thick walled with prominent periappendiceal fat stranding, compatible with acute appendicitis. Appendix: Location: Right lower quadrant Diameter: 11 mm Appendicolith: Not present Mucosal hyper-enhancement: Present Extraluminal gas: Not present Periappendiceal collection: Not present Mild wall thickening in the cecum is probably reactive. Transverse and left colon are relatively collapsed. Oral contrast reaches the hepatic flexure. Vascular/Lymphatic: Normal caliber abdominal aorta. Patent portal, splenic, hepatic and renal veins. Mildly enlarged right lower quadrant mesenteric nodes, largest 1.2 cm (series 2/image 50). Reproductive: Uterus is heterogeneous, which may indicate small fibroids. No adnexal masses. Other: No pneumoperitoneum, ascites or focal fluid collection. Musculoskeletal: No aggressive appearing focal osseous lesions. Non expansile sclerotic anterior right seventh rib lesion, nonspecific, probably a benign bone island. Moderate lumbar spondylosis. IMPRESSION: 1. Acute appendicitis. No free air. No abscess. No appendicoliths. 2. Mild cecal wall thickening, probably reactive. 3. Mild right lower quadrant mesenteric  adenopathy, probably reactive. 4. Small hiatal hernia. 5. Heterogeneous uterus, nonspecific, which  may indicate small fibroids. Outpatient pelvic sonography correlation may be obtained as clinically warranted. These results were called by telephone at the time of interpretation on 04/25/2017 at 7:40 pm to Dr. Delia Chimes , who verbally acknowledged these results. Electronically Signed   By: Ilona Sorrel M.D.   On: 04/25/2017 19:43    Assessment: s/p Procedure(s): APPENDECTOMY LAPAROSCOPIC Patient Active Problem List   Diagnosis Date Noted  . Appendicitis 04/25/2017  . Severe single current episode of major depressive disorder, without psychotic features ([place]) 03/17/2016  . Menopausal and perimenopausal disorder 03/17/2016    Expected post op course  Plan: Advance diet  PO pain meds Ambulate Possible d/c later today   LOS: 1 day     .Rosario Adie, [facility], [place]   04/26/2017 8:38 AM

## 2017-04-26 NOTE — Transfer of Care (Signed)
Immediate Anesthesia Transfer of Care Note  Patient: Samantha Barry  Procedure(s) Performed: APPENDECTOMY LAPAROSCOPIC (N/A Abdomen)  Patient Location: PACU  Anesthesia Type:General  Level of Consciousness: sedated and patient cooperative  Airway & Oxygen Therapy: Patient Spontanous Breathing and Patient connected to face mask oxygen  Post-op Assessment: Report given to RN and Post -op Vital signs reviewed and stable  Post vital signs: Reviewed and stable  Last Vitals:  Vitals:   04/25/17 2040 04/26/17 0006  BP: 119/82 130/80  Pulse: 96 (!) 113  Resp: 16 18  Temp: 36.6 C 36.5 C  SpO2: 96% 100%    Last Pain:  Vitals:   04/25/17 2040  TempSrc: Oral         Complications: No apparent anesthesia complications

## 2017-04-26 NOTE — Discharge Instructions (Signed)
Laparoscopic Appendectomy, Adult, Care After Refer to this sheet in the next few weeks. These instructions provide you with information about caring for yourself after your procedure. Your health care provider may also give you more specific instructions. Your treatment has been planned according to current medical practices, but problems sometimes occur. Call your health care provider if you have any problems or questions after your procedure. What can I expect after the procedure? After the procedure, it is common to have:  A decrease in your energy level.  Mild pain in the area where the surgical cuts (incisions) were made.  Constipation. This can be caused by pain medicine and a decrease in your activity.  Follow these instructions at home: Medicines  Take over-the-counter and prescription medicines only as told by your health care provider.  Do not drive for 24 hours if you received a sedative.  Do not drive or operate heavy machinery while taking prescription pain medicine.  If you were prescribed an antibiotic medicine, take it as told by your health care provider. Do not stop taking the antibiotic even if you start to feel better. Activity  For 3 weeks or as long as told by your health care provider: ? Do not lift anything that is heavier than 10 pounds (4.5 kg). ? Do not play contact sports.  Gradually return to your normal activities. Ask your health care provider what activities are safe for you. Bathing  Keep your incisions clean and dry. Clean them as often as told by your health care provider: ? Gently wash the incisions with soap and water. ? Rinse the incisions with water to remove all soap. ? Pat the incisions dry with a clean towel. Do not rub the incisions.  You may take showers after 48 hours.  Do not take baths, swim, or use hot tubs for 2 weeks or as told by your health care provider. Incision care  Follow instructions from your healthcare provider about  how to take care of your incisions. Make sure you: ? Wash your hands with soap and water before you change your bandage (dressing). If soap and water are not available, use hand sanitizer. ? Change your dressing as told by your health care provider. ? Leave stitches (sutures), skin glue, or adhesive strips in place. These skin closures may need to stay in place for 2 weeks or longer. If adhesive strip edges start to loosen and curl up, you may trim the loose edges. Do not remove adhesive strips completely unless your health care provider tells you to do that.  Check your incision areas every day for signs of infection. Check for: ? More redness, swelling, or pain. ? More fluid or blood. ? Warmth. ? Pus or a bad smell. Other Instructions  If you were sent home with a drain, follow instructions from your health care provider about how to care for the drain and how to empty it.  Take deep breaths. This helps to prevent your lungs from becoming inflamed.  To relieve and prevent constipation: ? Drink plenty of fluids. ? Eat plenty of fruits and vegetables.  Keep all follow-up visits as told by your health care provider. This is important. Contact a health care provider if:  You have more redness, swelling, or pain around an incision.  You have more fluid or blood coming from an incision.  Your incision feels warm to the touch.  You have pus or a bad smell coming from an incision or dressing.  Your incision  edges break open after your sutures have been removed.  You have increasing pain in your shoulders.  You feel dizzy or you faint.  You develop shortness of breath.  You keep feeling nauseous or vomiting.  You have diarrhea or you cannot control your bowel functions.  You lose your appetite.  You develop swelling or pain in your legs. Get help right away if:  You have a fever.  You develop a rash.  You have difficulty breathing.  You have sharp pains in your  chest. This information is not intended to replace advice given to you by your health care provider. Make sure you discuss any questions you have with your health care provider. Document Released: 02/22/2005 Document Revised: 07/25/2015 Document Reviewed: 08/12/2014 Elsevier Interactive Patient Education  2018 [place] ______[employer] LAPAROSCOPIC SURGERY: POST OP INSTRUCTIONS Always review your discharge instruction sheet given to you by the facility where your surgery was performed. IF YOU HAVE DISABILITY OR FAMILY LEAVE FORMS, YOU MUST BRING THEM TO THE OFFICE FOR PROCESSING.   DO NOT GIVE THEM TO YOUR DOCTOR.  1. A prescription for pain medication may be given to you upon discharge.  Take your pain medication as prescribed, if needed.  If narcotic pain medicine is not needed, then you may take acetaminophen (Tylenol) or ibuprofen (Advil) as needed. 2. Take your usually prescribed medications unless otherwise directed. 3. If you need a refill on your pain medication, please contact your pharmacy.  They will contact our office to request authorization. Prescriptions will not be filled after 5pm or on week-ends. 4. You should follow a light diet the first few days after arrival home, such as soup and crackers, etc.  Be sure to include lots of fluids daily. 5. Most patients will experience some swelling and bruising in the area of the incisions.  Ice packs will help.  Swelling and bruising can take several days to resolve.  6. It is common to experience some constipation if taking pain medication after surgery.  Increasing fluid intake and taking a stool softener (such as Colace) will usually help or prevent this problem from occurring.  A mild laxative (Milk of Magnesia or Miralax) should be taken according to package instructions if there are no bowel movements after 48 hours. 7. Unless discharge instructions indicate otherwise, you may remove your bandages 24-48  hours after surgery, and you may shower at that time.  You may have steri-strips (small skin tapes) in place directly over the incision.  These strips should be left on the skin for 7-10 days.  If your surgeon used skin glue on the incision, you may shower in 24 hours.  The glue will flake off over the next 2-3 weeks.  Any sutures or staples will be removed at the office during your follow-up visit. 8. ACTIVITIES:  You may resume regular (light) daily activities beginning the next day--such as daily self-care, walking, climbing stairs--gradually increasing activities as tolerated.  You may have sexual intercourse when it is comfortable.  Refrain from any heavy lifting or straining until approved by your doctor. a. You may drive when you are no longer taking prescription pain medication, you can comfortably wear a seatbelt, and you can safely maneuver your car and apply brakes. b. RETURN TO WORK:  __________________________________________________________ 9. You should see your doctor in the office for a follow-up appointment approximately 2-3 weeks after your surgery.  Make sure that you call for this appointment within a day or  two after you arrive home to insure a convenient appointment time. 10. OTHER INSTRUCTIONS: __________________________________________________________________________________________________________________________ __________________________________________________________________________________________________________________________ WHEN TO CALL YOUR DOCTOR: 1. Fever over 101.0 2. Inability to urinate 3. Continued bleeding from incision. 4. Increased pain, redness, or drainage from the incision. 5. Increasing abdominal pain  The clinic staff is available to answer your questions during regular business hours.  Please dont hesitate to call and ask to speak to one of the nurses for clinical concerns.  If you have a medical emergency, go to the nearest emergency room or call  911.  A surgeon from [facility] is always on call at the hospital. [address] [place], [place], [place]  [address] ? [address], [place], [place]   [phone] ? [phone] ? FAX [phone] Web site: [email or web address]

## 2017-04-28 ENCOUNTER — Telehealth: Payer: Self-pay

## 2017-04-28 NOTE — Telephone Encounter (Signed)
Transition Care Management Follow-up Telephone Call   Date discharged? 04/26/17   How have you been since you were released from the hospital? Patient states that she is feeling much better since being discharged   Do you understand why you were in the hospital? yes   Do you understand the discharge instructions? yes   Where were you discharged to? home   Items Reviewed:  Medications reviewed: yes  Allergies reviewed: yes  Dietary changes reviewed: yes  Referrals reviewed: yes   Functional Questionnaire:   Activities of Daily Living (ADLs):   She states they are independent in the following: ambulation, bathing and hygiene, feeding, continence, grooming, toileting and dressing States they require assistance with the following: none   Any transportation issues/concerns?: no   Any patient concerns? no   Confirmed importance and date/time of follow-up visits scheduled yes Provider Appointment booked with- Patient states that she will call back next week and schedule a follow up visit with Dr. Nolon Rod. She does not want to go out much this week and just stay at home and allow her self to heal.   Confirmed with patient if condition begins to worsen call PCP or go to the ER.  Patient was given the office number and encouraged to call back with question or concerns.  : yes

## 2017-04-28 NOTE — Discharge Summary (Signed)
Physician Discharge Summary  Patient ID: Samantha Barry MRN: [id number] DOB/AGE: 52-May-1967 52 y.o.  Admit date: 04/25/2017 Discharge date: 04/26/2017  Admission Diagnoses:  Acute appendicitis without mention of peritonitis ADHD Hx of Depression Hx of urinary incontinence  Discharge Diagnoses:  Same  Active Problems:   Appendicitis   PROCEDURES:  Laparoscopic appendectomy, 04/25/17, Dr. Janalyn Rouse Course:  52 year old female developed right-sided abdominal pain Sunday evening.  She states the pain was constant, sharp.  She was able to get some sleep but did not feel like going to work today.  The pain was persistent this morning.  She contacted her primary care and was able to get an appointment late this afternoon.  She states that she had a temperature of around 99 last night.  She was mildly tachycardic in the physician's office.  And given her right lower quadrant pain there was concern for appendicitis.  CBC was checked and the patient was scheduled for urgent CT scan this evening.  The patient's CT showed acute appendicitis She denies any similar prior symptoms.  She denies any nausea, vomiting, diarrhea or constipation.  Last bowel movement was this morning.  She denies any melena or hematochezia.  She denies any shortness of breath or chest pain.  She does not smoke She was seen in the ED by Dr. Greer Pickerel and it was his opinion she had acute appendicitis.  He recommended surgery and she was taken to the OR, directly from the ED.  She  Did well with the surgery and was transferred to the floor. She was seen by Dr. Marcello Moores early that following AM.  Her diet was advanced, she was mobilized and was ready for discharge the first post op morning.  Follow up is listed below.  Condition on d/c:  Improved  CBC Latest Ref Rng & Units 04/26/2017 04/25/2017 03/10/2016  WBC 4.0 - 10.5 K/uL 10.5 8.7 7.1  Hemoglobin 12.0 - 15.0 g/dL 11.4(L) 12.3 13.2  Hematocrit 36.0 - 46.0 % 34.5(L)  38.3 40.4  Platelets 150 - 400 K/uL 143(L) - 234   CMP Latest Ref Rng & Units 04/26/2017 04/25/2017 03/10/2016  Glucose 65 - 99 mg/dL 198(H) 160(H) 90  BUN 6 - 20 mg/dL 9 7 13   Creatinine 0.44 - 1.00 mg/dL 0.75 0.88 0.85  Sodium 135 - 145 mmol/L 137 136 140  Potassium 3.5 - 5.1 mmol/L 4.2 3.9 4.4  Chloride 101 - 111 mmol/L 106 104 106  CO2 22 - 32 mmol/L 22 24 21   Calcium 8.9 - 10.3 mg/dL 8.8(L) 8.8(L) 9.4  Total Protein 6.1 - 8.1 g/dL - - 7.6  Total Bilirubin 0.2 - 1.2 mg/dL - - 0.5  Alkaline Phos 33 - 130 U/L - - 80  AST 10 - 35 U/L - - 23  ALT 6 - 29 U/L - - 15    Disposition: 01-Home or Self Care   Allergies as of 04/26/2017   No Known Allergies     Medication List    TAKE these medications   acetaminophen 325 MG tablet Commonly known as:  TYLENOL You can take 2 tablets every 4-6 hours as needed for pain.  You can alternate with plain Tylenol or Ibuprofen, or the prescribed pain medicine.    DO NOT TAKE MORE THAN 4000 MG OF TYLENOL PER DAY.  IT CAN HARM YOUR LIVER.  TYLENOL (ACETAMINOPHEN) IS ALSO IN YOUR PRESCRIPTION PAIN MEDICATION.  YOU HAVE TO COUNT IT IN YOUR DAILY TOTAL.   ALPRAZolam 0.25  MG tablet Commonly known as:  XANAX Take 0.25 mg by mouth at bedtime as needed for anxiety.   amphetamine-dextroamphetamine 20 MG tablet Commonly known as:  ADDERALL Take 20 mg by mouth daily.   escitalopram 20 MG tablet Commonly known as:  LEXAPRO Take 20 mg by mouth daily.   fluticasone 50 MCG/ACT nasal spray Commonly known as:  FLONASE Place 2 sprays into both nostrils daily.   HYDROcodone-acetaminophen 5-325 MG tablet Commonly known as:  NORCO/VICODIN Take 1-2 tablets by mouth every 4 (four) hours as needed for moderate pain or severe pain.   ibuprofen 200 MG tablet Commonly known as:  ADVIL,MOTRIN You can take 2-3 tablets every 6 hours as needed for pain.  You can alternate with Tylenol or the prescribed pain medicine also.  You can buy this over the counter at any  drug store.   lisdexamfetamine 60 MG capsule Commonly known as:  VYVANSE Take 60 mg by mouth every morning.   REXULTI 1 MG Tabs Generic drug:  Brexpiprazole Take 2 mg by mouth at bedtime.      Follow-up Information    [facility] Follow up on 05/10/2017.   Specialty:  General Surgery Why:  Your appointment is at 11:15 AM.  Be at the office 30 minutes early for check in.  Bring photo ID and insurance information. Contact information: [address] [place] [address] [phone]        Forrest Moron, MD Follow up.   Specialty:  Internal Medicine Why:  call and let them know you have had surgery.  Follow up for medical issues. Contact information: [place] [address] [phone]           Signed: Earnstine Regal 04/28/2017, 12:13 PM

## 2017-05-09 ENCOUNTER — Encounter: Payer: Self-pay | Admitting: Family Medicine

## 2017-05-09 ENCOUNTER — Ambulatory Visit: Payer: BC Managed Care – PPO | Admitting: Family Medicine

## 2017-05-09 ENCOUNTER — Other Ambulatory Visit: Payer: Self-pay

## 2017-05-09 VITALS — BP 108/74 | HR 106 | Temp 98.5°F | Resp 16 | Ht 65.0 in | Wt 177.2 lb

## 2017-05-09 DIAGNOSIS — R000 Tachycardia, unspecified: Secondary | ICD-10-CM

## 2017-05-09 DIAGNOSIS — R509 Fever, unspecified: Secondary | ICD-10-CM | POA: Diagnosis not present

## 2017-05-09 DIAGNOSIS — Z9049 Acquired absence of other specified parts of digestive tract: Secondary | ICD-10-CM | POA: Diagnosis not present

## 2017-05-09 LAB — POCT CBC
GRANULOCYTE PERCENT: 68.6 % (ref 37–80)
HCT, POC: 39.2 % (ref 37.7–47.9)
Hemoglobin: 12.9 g/dL (ref 12.2–16.2)
Lymph, poc: 2.1 (ref 0.6–3.4)
MCH, POC: 27.3 pg (ref 27–31.2)
MCHC: 32.9 g/dL (ref 31.8–35.4)
MCV: 83.1 fL (ref 80–97)
MID (CBC): 0.4 (ref 0–0.9)
MPV: 7.5 fL (ref 0–99.8)
POC GRANULOCYTE: 5.5 (ref 2–6.9)
POC LYMPH %: 26.5 % (ref 10–50)
POC MID %: 4.9 %M (ref 0–12)
Platelet Count, POC: 197 10*3/uL (ref 142–424)
RBC: 4.72 M/uL (ref 4.04–5.48)
RDW, POC: 14.1 %
WBC: 8 10*3/uL (ref 4.6–10.2)

## 2017-05-09 LAB — POCT URINALYSIS DIP (MANUAL ENTRY)
BILIRUBIN UA: NEGATIVE
Glucose, UA: NEGATIVE mg/dL
Ketones, POC UA: NEGATIVE mg/dL
LEUKOCYTES UA: NEGATIVE
Nitrite, UA: NEGATIVE
PH UA: 5.5 (ref 5.0–8.0)
PROTEIN UA: NEGATIVE mg/dL
Spec Grav, UA: 1.015 (ref 1.010–1.025)
Urobilinogen, UA: 0.2 E.U./dL

## 2017-05-09 NOTE — Progress Notes (Signed)
Chief Complaint  Patient presents with  . Hospitalization Follow-up    felt feverish, body aches, Sweating     HPI   Pt was seen on 04/25/17 patient was diagnosed with acute appendicits by stat CT.  She was taken to the OR on 04/25/17 for laparascopic appendectomy.  She was discharged home without complication 05/07/58. She was released to return to work.  She states that has been feverish today but reports that she has been eating and having normal BM. She states that she has been having fatigue as well.  She denies any shortness of breath, dysuria or chest pains. She also denies calf pains, redness or swellings.       Past Medical History:  Diagnosis Date  . ADHD   . Depression   . Urinary incontinence     Current Outpatient Medications  Medication Sig Dispense Refill  . acetaminophen (TYLENOL) 325 MG tablet You can take 2 tablets every 4-6 hours as needed for pain.  You can alternate with plain Tylenol or Ibuprofen, or the prescribed pain medicine.    DO NOT TAKE MORE THAN 4000 MG OF TYLENOL PER DAY.  IT CAN HARM YOUR LIVER.  TYLENOL (ACETAMINOPHEN) IS ALSO IN YOUR PRESCRIPTION PAIN MEDICATION.  YOU HAVE TO COUNT IT IN YOUR DAILY TOTAL. (Patient not taking: Reported on 05/09/2017)    . ALPRAZolam (XANAX) 0.25 MG tablet Take 0.25 mg by mouth at bedtime as needed for anxiety.    Marland Kitchen amphetamine-dextroamphetamine (ADDERALL) 20 MG tablet Take 20 mg by mouth daily.    Marland Kitchen escitalopram (LEXAPRO) 20 MG tablet Take 20 mg by mouth daily.  5  . fluticasone (FLONASE) 50 MCG/ACT nasal spray Place 2 sprays into both nostrils daily. 16 g 6  . HYDROcodone-acetaminophen (NORCO/VICODIN) 5-325 MG tablet Take 1-2 tablets by mouth every 4 (four) hours as needed for moderate pain or severe pain. (Patient not taking: Reported on 05/09/2017) 15 tablet 0  . ibuprofen (ADVIL,MOTRIN) 200 MG tablet You can take 2-3 tablets every 6 hours as needed for pain.  You can alternate with Tylenol or the prescribed pain  medicine also.  You can buy this over the counter at any drug store.    . lisdexamfetamine (VYVANSE) 60 MG capsule Take 60 mg by mouth every morning.    Marland Kitchen REXULTI 1 MG TABS Take 2 mg by mouth at bedtime.     Marland Kitchen SILENOR 3 MG TABS      No current facility-administered medications for this visit.     Allergies: No Known Allergies  Past Surgical History:  Procedure Laterality Date  . CHOLECYSTECTOMY    . LAPAROSCOPIC APPENDECTOMY N/A 04/25/2017   Procedure: APPENDECTOMY LAPAROSCOPIC;  Surgeon: Greer Pickerel, MD;  Location: [facility];  Service: General;  Laterality: N/A;    Social History   Socioeconomic History  . Marital status: Single    Spouse name: Not on file  . Number of children: Not on file  . Years of education: Not on file  . Highest education level: Not on file  Occupational History  . Not on file  Social Needs  . Financial resource strain: Not on file  . Food insecurity:    Worry: Not on file    Inability: Not on file  . Transportation needs:    Medical: Not on file    Non-medical: Not on file  Tobacco Use  . Smoking status: Never Smoker  . Smokeless tobacco: Never Used  Substance and Sexual Activity  . Alcohol use:  Yes    Alcohol/week: 1.2 - 1.8 oz    Types: 2 - 3 Standard drinks or equivalent per week  . Drug use: No  . Sexual activity: Never    Partners: Female    Birth control/protection: None  Lifestyle  . Physical activity:    Days per week: Not on file    Minutes per session: Not on file  . Stress: Not on file  Relationships  . Social connections:    Talks on phone: Not on file    Gets together: Not on file    Attends religious service: Not on file    Active member of club or organization: Not on file    Attends meetings of clubs or organizations: Not on file    Relationship status: Not on file  Other Topics Concern  . Not on file  Social History Narrative   Teacher - Teacher, early years/pre    Family History  Problem Relation Age of Onset  .  Scoliosis Mother   . Osteoporosis Mother   . Hypertension Father   . Hypertension Maternal Grandmother   . Hodgkin's lymphoma Maternal Grandmother      ROS Review of Systems See HPI Constitution: see hpi Skin: No rash or itching Eyes: no blurry vision, no double vision GU: no dysuria or hematuria Neuro: no dizziness or headaches  all others reviewed and negative   Objective: Vitals:   05/09/17 1630  BP: 108/74  Pulse: (!) 106  Resp: 16  Temp: 98.5 F (36.9 C)  SpO2: 98%  Weight: 177 lb 3.2 oz (80.4 kg)  Height: 5' 5"  (1.651 m)    Physical Exam  Physical Exam  Constitutional: She is oriented to person, place, and time. She appears well-developed and well-nourished.  HENT:  Head: Normocephalic and atraumatic.  Eyes: Conjunctivae and EOM are normal.  Cardiovascular: Normal rate, regular rhythm and normal heart sounds.   Pulmonary/Chest: Effort normal and breath sounds normal. No respiratory distress. She has no wheezes.  Abdominal: Normal appearance and bowel sounds are normal. There is no tenderness. There is no CVA tenderness. only mild tenderness to deep palpation. incisional scars healing well. Neurological: She is alert and oriented to person, place, and time.     Assessment and Plan Ruble was seen today for hospitalization follow-up.  Diagnoses and all orders for this visit:  Feels feverish- no leukocytosis or UTI Continue rest and hydration  -     POCT urinalysis dipstick -     POCT CBC  Tachycardia- pt advised to push fluids, she is at her baseline heart rate  S/P laparoscopic appendectomy- work note given      Forrest Moron

## 2017-05-09 NOTE — Patient Instructions (Signed)
     IF you received an x-ray today, you will receive an invoice from [facility]. Please contact [facility] at [phone] with questions or concerns regarding your invoice.   IF you received labwork today, you will receive an invoice from [facility]. Please contact [facility] at [phone] with questions or concerns regarding your invoice.   Our billing staff will not be able to assist you with questions regarding bills from these companies.  You will be contacted with the lab results as soon as they are available. The fastest way to get your results is to activate your My Chart account. Instructions are located on the last page of this paperwork. If you have not heard from us regarding the results in 2 weeks, please contact this office.     

## 2017-05-16 ENCOUNTER — Telehealth: Payer: Self-pay

## 2017-05-16 NOTE — Telephone Encounter (Signed)
Provider, OK to write note? Or does patient need to return for office visit?

## 2017-05-16 NOTE — Telephone Encounter (Signed)
Copied from [place] [id number]. Topic: General - Other >> May 16, 2017  1:37 PM Burnis Medin, NT wrote: Reason for CRM: Patient called and said she was just seen in the office and the doctor had written her out of work. Patient said she has returned to work and is experiencing some pain and wanted to see if the doctor could write her a note to work only half days for this week. Pt would like a call back.  >> May 16, 2017  3:58 PM Bea Graff, NT wrote: Pt calling to check status on her work note for tomorrow.

## 2017-05-18 ENCOUNTER — Other Ambulatory Visit: Payer: Self-pay

## 2017-05-18 ENCOUNTER — Encounter: Payer: Self-pay | Admitting: Family Medicine

## 2017-05-18 ENCOUNTER — Ambulatory Visit: Payer: BC Managed Care – PPO | Admitting: Family Medicine

## 2017-05-18 VITALS — BP 100/72 | HR 121 | Temp 99.0°F | Resp 18 | Ht 65.0 in | Wt 175.0 lb

## 2017-05-18 DIAGNOSIS — R1032 Left lower quadrant pain: Secondary | ICD-10-CM | POA: Diagnosis not present

## 2017-05-18 DIAGNOSIS — Z9049 Acquired absence of other specified parts of digestive tract: Secondary | ICD-10-CM | POA: Diagnosis not present

## 2017-05-18 DIAGNOSIS — R3121 Asymptomatic microscopic hematuria: Secondary | ICD-10-CM

## 2017-05-18 LAB — POCT URINALYSIS DIP (MANUAL ENTRY)
BILIRUBIN UA: NEGATIVE
GLUCOSE UA: NEGATIVE mg/dL
LEUKOCYTES UA: NEGATIVE
Nitrite, UA: NEGATIVE
Protein Ur, POC: NEGATIVE mg/dL
SPEC GRAV UA: 1.025 (ref 1.010–1.025)
Urobilinogen, UA: 0.2 E.U./dL
pH, UA: 6 (ref 5.0–8.0)

## 2017-05-18 NOTE — Progress Notes (Signed)
Chief Complaint  Patient presents with  . Nausea    woke up this morning feeling that way and having pain in lower abdomen where surgery was     HPI  Pt returned to work Monday and Tuesday (2 days ago and yesterday) and has been working but was not feeling well. 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 ADHD   . Depression   . Urinary incontinence     Current Outpatient Medications  Medication Sig Dispense Refill  . ALPRAZolam (XANAX) 0.25 MG tablet Take 0.25 mg by mouth at bedtime as needed for anxiety.    Marland Kitchen amphetamine-dextroamphetamine (ADDERALL) 20 MG tablet Take 20 mg by mouth daily.    Marland Kitchen escitalopram (LEXAPRO) 20 MG tablet Take 20 mg by mouth daily.  5  . fluticasone (FLONASE) 50 MCG/ACT nasal spray Place 2 sprays into both nostrils daily. 16 g 6  . ibuprofen (ADVIL,MOTRIN) 200 MG tablet You can take 2-3 tablets every 6 hours as needed for pain.  You can alternate with Tylenol or the prescribed pain medicine also.  You can buy this over the counter at any drug store.    . lisdexamfetamine (VYVANSE) 60 MG capsule Take 60 mg by mouth every morning.    Marland Kitchen REXULTI 1 MG TABS Take 2 mg by mouth at bedtime.     Marland Kitchen SILENOR 3 MG TABS     . acetaminophen (TYLENOL) 325 MG tablet You can take 2 tablets every 4-6 hours as needed for pain.  You can alternate with plain Tylenol or Ibuprofen, or the prescribed pain medicine.    DO NOT TAKE MORE THAN 4000 MG OF TYLENOL PER DAY.  IT CAN HARM YOUR LIVER.  TYLENOL (ACETAMINOPHEN) IS ALSO IN YOUR PRESCRIPTION PAIN MEDICATION.  YOU HAVE TO COUNT IT IN YOUR DAILY TOTAL. (Patient not taking: Reported on 05/09/2017)    . HYDROcodone-acetaminophen (NORCO/VICODIN) 5-325 MG tablet Take 1-2 tablets by mouth every 4 (four) hours as needed for moderate pain or severe pain. (Patient not taking:  Reported on 05/09/2017) 15 tablet 0   No current facility-administered medications for this visit.     Allergies: No Known Allergies  Past Surgical History:  Procedure Laterality Date  . CHOLECYSTECTOMY    . LAPAROSCOPIC APPENDECTOMY N/A 04/25/2017   Procedure: APPENDECTOMY LAPAROSCOPIC;  Surgeon: Greer Pickerel, MD;  Location: [facility];  Service: General;  Laterality: N/A;    Social History   Socioeconomic History  . Marital status: Single    Spouse name: None  . Number of children: None  . Years of education: None  . Highest education level: None  Social Needs  . Financial resource strain: None  . Food insecurity - worry: None  . Food insecurity - inability: None  . Transportation needs - medical: None  . Transportation needs - non-medical: None  Occupational History  . None  Tobacco Use  . Smoking status: Never Smoker  . Smokeless tobacco: Never Used  Substance and Sexual Activity  . Alcohol use: Yes    Alcohol/week: 1.2 - 1.8 oz    Types: 2 - 3 Standard drinks or equivalent per week  . Drug use: No  . Sexual activity: No    Partners: Female    Birth control/protection: None  Other Topics Concern  .  None  Social History Narrative   Teacher - Teacher, early years/pre    Family History  Problem Relation Age of Onset  . Scoliosis Mother   . Osteoporosis Mother   . Hypertension Father   . Hypertension Maternal Grandmother   . Hodgkin's lymphoma Maternal Grandmother      ROS Review of Systems See HPI Constitution: No fevers or chills No malaise No diaphoresis Skin: No rash or itching Eyes: no blurry vision, no double vision GU: see hpi Neuro: no dizziness or headaches all others reviewed and negative   Objective: Vitals:   05/18/17 1616  BP: 100/72  Pulse: (!) 121  Resp: 18  Temp: 99 F (37.2 C)  TempSrc: Oral  SpO2: 97%  Weight: 175 lb (79.4 kg)  Height: 5' 5"  (1.651 m)    Physical Exam  Physical Exam  Constitutional: She is oriented to person,  place, and time. She appears well-developed and well-nourished.  HENT:  Head: Normocephalic and atraumatic.  Eyes: Conjunctivae and EOM are normal.  Cardiovascular: Normal rate, regular rhythm and normal heart sounds.   Pulmonary/Chest: Effort normal and breath sounds normal. No respiratory distress. She has no wheezes.  Abdominal: Normal appearance and bowel sounds are normal. There is tenderness. There is no CVA tenderness.   Neurological: She is alert and oriented to person, place, and time.     IMPRESSION: 1. Acute appendicitis. No free air. No abscess. No appendicoliths. 2. Mild cecal wall thickening, probably reactive. 3. Mild right lower quadrant mesenteric adenopathy, probably reactive. 4. Small hiatal hernia. 5. Heterogeneous uterus, nonspecific, which may indicate small fibroids. Outpatient pelvic sonography correlation may be obtained as clinically warranted.  These results were called by telephone at the time of interpretation on 04/25/2017 at 7:40 pm to Dr. Delia Chimes , who verbally acknowledged these results.   Electronically Signed   By: Ilona Sorrel M.D.   On: 04/25/2017 19:43  Assessment and Plan Donnalee was seen today for nausea.  Diagnoses and all orders for this visit:  Left lower quadrant pain- discussed that this could also be related to postop healing  -     POCT urinalysis dipstick -     Urine Culture  Asymptomatic microscopic hematuria- will send culture -     Urine Culture  S/P laparoscopic appendectomy- advised pt to continue to rest for a few days Gave work note  Advised that if symptoms persist to follow up with [facility]

## 2017-05-18 NOTE — Patient Instructions (Signed)
     IF you received an x-ray today, you will receive an invoice from [facility]. Please contact [facility] at [phone] with questions or concerns regarding your invoice.   IF you received labwork today, you will receive an invoice from [facility]. Please contact [facility] at [phone] with questions or concerns regarding your invoice.   Our billing staff will not be able to assist you with questions regarding bills from these companies.  You will be contacted with the lab results as soon as they are available. The fastest way to get your results is to activate your My Chart account. Instructions are located on the last page of this paperwork. If you have not heard from us regarding the results in 2 weeks, please contact this office.     

## 2017-05-19 LAB — URINE CULTURE

## 2017-06-30 ENCOUNTER — Other Ambulatory Visit: Payer: Self-pay

## 2017-06-30 MED ORDER — FLUTICASONE PROPIONATE 50 MCG/ACT NA SUSP: 2.0000 | Freq: Every day | NASAL | 3 refills | Status: DC

## 2017-07-18 ENCOUNTER — Telehealth: Payer: Self-pay | Admitting: Family Medicine

## 2017-07-18 NOTE — Telephone Encounter (Signed)
Pt needs FMLA forms completed by Dr Nolon Rod for her acute appendicitis that she was treated for in Feb 2019. I have completed the forms based off the OV notes. I will place the forms in Dr Nolon Rod box on 07/18/17, please sign the forms and return them to the FMLA/Disability desk within 5-7 business days. Thank you!

## 2017-07-19 NOTE — Telephone Encounter (Signed)
Form completed and sent to [facility] desk

## 2017-07-20 NOTE — Telephone Encounter (Signed)
Paperwork scanned and faxed on 07/20/17

## 2017-07-28 DIAGNOSIS — Z0271 Encounter for disability determination: Secondary | ICD-10-CM

## 2018-09-05 ENCOUNTER — Ambulatory Visit: Payer: BC Managed Care – PPO | Admitting: Family Medicine

## 2018-09-05 ENCOUNTER — Other Ambulatory Visit: Payer: Self-pay

## 2018-09-05 ENCOUNTER — Encounter: Payer: Self-pay | Admitting: Family Medicine

## 2018-09-05 VITALS — BP 115/80 | HR 122 | Temp 98.5°F | Resp 17 | Ht 65.0 in | Wt 190.0 lb

## 2018-09-05 DIAGNOSIS — R159 Full incontinence of feces: Secondary | ICD-10-CM | POA: Diagnosis not present

## 2018-09-05 DIAGNOSIS — R152 Fecal urgency: Secondary | ICD-10-CM | POA: Diagnosis not present

## 2018-09-05 DIAGNOSIS — K644 Residual hemorrhoidal skin tags: Secondary | ICD-10-CM | POA: Diagnosis not present

## 2018-09-05 DIAGNOSIS — Z1211 Encounter for screening for malignant neoplasm of colon: Secondary | ICD-10-CM

## 2018-09-05 DIAGNOSIS — R300 Dysuria: Secondary | ICD-10-CM | POA: Diagnosis not present

## 2018-09-05 LAB — POCT URINALYSIS DIP (MANUAL ENTRY)
Glucose, UA: NEGATIVE mg/dL
Nitrite, UA: NEGATIVE
Protein Ur, POC: 100 mg/dL — AB
Spec Grav, UA: >=1.03 — AB (ref 1.010–1.025)
Urobilinogen, UA: 1 E.U./dL
pH, UA: 5 (ref 5.0–8.0)

## 2018-09-05 NOTE — Patient Instructions (Addendum)
If you have lab work done today you will be contacted with your lab results within the next 2 weeks.  If you have not heard from [place] then please contact us. The fastest way to get your results is to register for My Chart.   IF you received an x-ray today, you will receive an invoice from [facility]. Please contact [facility] at [phone] with questions or concerns regarding your invoice.   IF you received labwork today, you will receive an invoice from [facility]. Please contact [facility] at [phone] with questions or concerns regarding your invoice.   Our billing staff will not be able to assist you with questions regarding bills from these companies.  You will be contacted with the lab results as soon as they are available. The fastest way to get your results is to activate your My Chart account. Instructions are located on the last page of this paperwork. If you have not heard from [place] regarding the results in 2 weeks, please contact this office.     Hemorrhoids Hemorrhoids are swollen veins in and around the rectum or anus. There are two types of hemorrhoids:  Internal hemorrhoids. These occur in the veins that are just inside the rectum. They may poke through to the outside and become irritated and painful.  External hemorrhoids. These occur in the veins that are outside the anus and can be felt as a painful swelling or hard lump near the anus. Most hemorrhoids do not cause serious problems, and they can be managed with home treatments such as diet and lifestyle changes. If home treatments do not help the symptoms, procedures can be done to shrink or remove the hemorrhoids. What are the causes? This condition is caused by increased pressure in the anal area. This pressure may result from various things, including:  Constipation.  Straining to have a bowel movement.  Diarrhea.  Pregnancy.  Obesity.  Sitting for long periods of time.  Heavy lifting  or other activity that causes you to strain.  Anal sex.  Riding a bike for a long period of time. What are the signs or symptoms? Symptoms of this condition include:  Pain.  Anal itching or irritation.  Rectal bleeding.  Leakage of stool (feces).  Anal swelling.  One or more lumps around the anus. How is this diagnosed? This condition can often be diagnosed through a visual exam. Other exams or tests may also be done, such as:  An exam that involves feeling the rectal area with a gloved hand (digital rectal exam).  An exam of the anal canal that is done using a small tube (anoscope).  A blood test, if you have lost a significant amount of blood.  A test to look inside the colon using a flexible tube with a camera on the end (sigmoidoscopy or colonoscopy). How is this treated? This condition can usually be treated at home. However, various procedures may be done if dietary changes, lifestyle changes, and other home treatments do not help your symptoms. These procedures can help make the hemorrhoids smaller or remove them completely. Some of these procedures involve surgery, and others do not. Common procedures include:  Rubber band ligation. Rubber bands are placed at the base of the hemorrhoids to cut off their blood supply.  Sclerotherapy. Medicine is injected into the hemorrhoids to shrink them.  Infrared coagulation. A type of light energy is used to get rid of the hemorrhoids.  Hemorrhoidectomy surgery. The hemorrhoids are surgically removed,  and the veins that supply them are tied off.  Stapled hemorrhoidopexy surgery. The surgeon staples the base of the hemorrhoid to the rectal wall. Follow these instructions at home: Eating and drinking   Eat foods that have a lot of fiber in them, such as whole grains, beans, nuts, fruits, and vegetables.  Ask your health care provider about taking products that have added fiber (fiber supplements).  Reduce the amount of fat  in your diet. You can do this by eating low-fat dairy products, eating less red meat, and avoiding processed foods.  Drink enough fluid to keep your urine pale yellow. Managing pain and swelling   Take warm sitz baths for 20 minutes, 3-4 times a day to ease pain and discomfort. You may do this in a bathtub or using a portable sitz bath that fits over the toilet.  If directed, apply ice to the affected area. Using ice packs between sitz baths may be helpful. ? Put ice in a plastic bag. ? Place a towel between your skin and the bag. ? Leave the ice on for 20 minutes, 2-3 times a day. General instructions  Take over-the-counter and prescription medicines only as told by your health care provider.  Use medicated creams or suppositories as told.  Get regular exercise. Ask your health care provider how much and what kind of exercise is best for you. In general, you should do moderate exercise for at least 30 minutes on most days of the week (150 minutes each week). This can include activities such as walking, biking, or yoga.  Go to the bathroom when you have the urge to have a bowel movement. Do not wait.  Avoid straining to have bowel movements.  Keep the anal area dry and clean. Use wet toilet paper or moist towelettes after a bowel movement.  Do not sit on the toilet for long periods of time. This increases blood pooling and pain.  Keep all follow-up visits as told by your health care provider. This is important. Contact a health care provider if you have:  Increasing pain and swelling that are not controlled by treatment or medicine.  Difficulty having a bowel movement, or you are unable to have a bowel movement.  Pain or inflammation outside the area of the hemorrhoids. Get help right away if you have:  Uncontrolled bleeding from your rectum. Summary  Hemorrhoids are swollen veins in and around the rectum or anus.  Most hemorrhoids can be managed with home treatments such  as diet and lifestyle changes.  Taking warm sitz baths can help ease pain and discomfort.  In severe cases, procedures or surgery can be done to shrink or remove the hemorrhoids. This information is not intended to replace advice given to you by your health care provider. Make sure you discuss any questions you have with your health care provider. Document Released: 02/20/2000 Document Revised: 03/02/2018 Document Reviewed: 07/14/2017 Elsevier Patient Education  2020 [employer].

## 2018-09-05 NOTE — Addendum Note (Signed)
Addended by: Leim Fabry on: 09/05/2018 04:39 PM   Modules accepted: Orders

## 2018-09-05 NOTE — Progress Notes (Signed)
Established Patient Office Visit  Subjective:  Patient ID: Samantha Barry, female    DOB: Apr 03, 1965  Age: 53 y.o. MRN: [id number]  CC:  Chief Complaint  Patient presents with  . Urinary Tract Infection    HPI [name] presents for   Dysuria Pt reports that she started having some urinary urgency and could not void her bladder She states that for the whole night she still had urgency  This morning it subsided but now she has urgency and frequency but very little urine output   Fecal Incontinence  She is also having fecal incontinence She stats that 3 weeks of fecal incontinence She states that she cannot hold it  She feels the urge to defecate and by then she is leaking She also has blood when she wipes her anus  She denies constipation Her stools are very loose She denies fecal incontinence in her sleep  No pain or itching  Past Medical History:  Diagnosis Date  . ADHD   . Depression   . Urinary incontinence     Past Surgical History:  Procedure Laterality Date  . CHOLECYSTECTOMY    . LAPAROSCOPIC APPENDECTOMY N/A 04/25/2017   Procedure: APPENDECTOMY LAPAROSCOPIC;  Surgeon: Greer Pickerel, MD;  Location: [facility];  Service: General;  Laterality: N/A;    Family History  Problem Relation Age of Onset  . Scoliosis Mother   . Osteoporosis Mother   . Hypertension Father   . Hypertension Maternal Grandmother   . Hodgkin's lymphoma Maternal Grandmother     Social History   Socioeconomic History  . Marital status: Single    Spouse name: Not on file  . Number of children: Not on file  . Years of education: Not on file  . Highest education level: Not on file  Occupational History  . Not on file  Social Needs  . Financial resource strain: Not on file  . Food insecurity    Worry: Not on file    Inability: Not on file  . Transportation needs    Medical: Not on file    Non-medical: Not on file  Tobacco Use  . Smoking status: Never Smoker  . Smokeless  tobacco: Never Used  Substance and Sexual Activity  . Alcohol use: Yes    Alcohol/week: 2.0 - 3.0 standard drinks    Types: 2 - 3 Standard drinks or equivalent per week  . Drug use: No  . Sexual activity: Never    Partners: Female    Birth control/protection: None  Lifestyle  . Physical activity    Days per week: Not on file    Minutes per session: Not on file  . Stress: Not on file  Relationships  . Social Herbalist on phone: Not on file    Gets together: Not on file    Attends religious service: Not on file    Active member of club or organization: Not on file    Attends meetings of clubs or organizations: Not on file    Relationship status: Not on file  . Intimate partner violence    Fear of current or ex partner: Not on file    Emotionally abused: Not on file    Physically abused: Not on file    Forced sexual activity: Not on file  Other Topics Concern  . Not on file  Social History Narrative   Teacher - Teacher, early years/pre    Outpatient Medications Prior to Visit  Medication Sig Dispense Refill  .  acetaminophen (TYLENOL) 325 MG tablet You can take 2 tablets every 4-6 hours as needed for pain.  You can alternate with plain Tylenol or Ibuprofen, or the prescribed pain medicine.    DO NOT TAKE MORE THAN 4000 MG OF TYLENOL PER DAY.  IT CAN HARM YOUR LIVER.  TYLENOL (ACETAMINOPHEN) IS ALSO IN YOUR PRESCRIPTION PAIN MEDICATION.  YOU HAVE TO COUNT IT IN YOUR DAILY TOTAL.    Marland Kitchen ALPRAZolam (XANAX) 0.25 MG tablet Take 0.25 mg by mouth at bedtime as needed for anxiety.    Marland Kitchen amphetamine-dextroamphetamine (ADDERALL) 20 MG tablet Take 20 mg by mouth daily.    Marland Kitchen escitalopram (LEXAPRO) 20 MG tablet Take 20 mg by mouth daily.  5  . fluticasone (FLONASE) 50 MCG/ACT nasal spray Place 2 sprays into both nostrils daily. 48 g 3  . HYDROcodone-acetaminophen (NORCO/VICODIN) 5-325 MG tablet Take 1-2 tablets by mouth every 4 (four) hours as needed for moderate pain or severe pain. 15  tablet 0  . ibuprofen (ADVIL,MOTRIN) 200 MG tablet You can take 2-3 tablets every 6 hours as needed for pain.  You can alternate with Tylenol or the prescribed pain medicine also.  You can buy this over the counter at any drug store.    . lisdexamfetamine (VYVANSE) 60 MG capsule Take 60 mg by mouth every morning.    Marland Kitchen REXULTI 1 MG TABS Take 2 mg by mouth at bedtime.     Marland Kitchen SILENOR 3 MG TABS      No facility-administered medications prior to visit.     No Known Allergies  ROS Review of Systems Review of Systems  Constitutional: Negative for activity change, appetite change, chills and fever.  HENT: Negative for congestion, nosebleeds, trouble swallowing and voice change.   Respiratory: Negative for cough, shortness of breath and wheezing.   Gastrointestinal: see hpi Genitourinary: see hpi Musculoskeletal: Negative for back pain, joint swelling and neck pain.  Neurological: Negative for dizziness, speech difficulty, light-headedness and numbness.  See HPI. All other review of systems negative.     Objective:    Physical Exam  BP 115/80 (BP Location: Left Arm, Patient Position: Sitting, Cuff Size: Normal)   Pulse (!) 122   Temp 98.5 F (36.9 C) (Oral)   Resp 17   Ht 5' 5"  (1.651 m)   Wt 190 lb (86.2 kg)   SpO2 96%   BMI 31.62 kg/m  Wt Readings from Last 3 Encounters:  09/05/18 190 lb (86.2 kg)  05/18/17 175 lb (79.4 kg)  05/09/17 177 lb 3.2 oz (80.4 kg)   Physical Exam  Constitutional: Oriented to person, place, and time. Appears well-developed and well-nourished.  HENT:  Head: Normocephalic and atraumatic.  Eyes: Conjunctivae and EOM are normal.  Cardiovascular: Normal rate, regular rhythm, normal heart sounds and intact distal pulses.  No murmur heard. Pulmonary/Chest: Effort normal and breath sounds normal. No stridor. No respiratory distress. Has no wheezes.  Neurological: Is alert and oriented to person, place, and time.  Skin: Skin is warm. Capillary refill takes  less than 2 seconds.  Psychiatric: Has a normal mood and affect. Behavior is normal. Judgment and thought content normal.    Chaperone present Anoscopy shows normal mucosa, no fissures, external hemorroid without thrombosis DRE with good anal tone, anal wink noted, palpable hemorrhoid FOBT not done  Health Maintenance Due  Topic Date Due  . HIV Screening  07/25/1980  . COLONOSCOPY  07/26/2015  . MAMMOGRAM  04/08/2018    There are no preventive care  reminders to display for this patient.  No results found for: TSH Lab Results  Component Value Date   WBC 8.0 05/09/2017   HGB 12.9 05/09/2017   HCT 39.2 05/09/2017   MCV 83.1 05/09/2017   PLT 143 (L) 04/26/2017   Lab Results  Component Value Date   NA 137 04/26/2017   K 4.2 04/26/2017   CO2 22 04/26/2017   GLUCOSE 198 (H) 04/26/2017   BUN 9 04/26/2017   CREATININE 0.75 04/26/2017   BILITOT 0.5 03/10/2016   ALKPHOS 80 03/10/2016   AST 23 03/10/2016   ALT 15 03/10/2016   PROT 7.6 03/10/2016   ALBUMIN 4.3 03/10/2016   CALCIUM 8.8 (L) 04/26/2017   ANIONGAP 9 04/26/2017   Lab Results  Component Value Date   CHOL 139 03/10/2016   Lab Results  Component Value Date   HDL 72 03/10/2016   Lab Results  Component Value Date   LDLCALC 55 03/10/2016   Lab Results  Component Value Date   TRIG 59 03/10/2016   Lab Results  Component Value Date   CHOLHDL 1.9 03/10/2016   No results found for: HGBA1C    Assessment & Plan:   Problem List Items Addressed This Visit    None    Visit Diagnoses    Dysuria    -  Primary Insufficient urine sample, pt to go home hydrate and bring back urine for clean catch urine culture   Relevant Orders   POCT urinalysis dipstick (Completed)   Urine Culture   Incontinence of feces with fecal urgency    - likely due to external hemorrhoid but will refer to GI since pt also needs colonoscopy    Relevant Orders   Ambulatory referral to Gastroenterology   Special screening for malignant  neoplasms, colon       Relevant Orders   Ambulatory referral to Gastroenterology   External hemorrhoid    - discussed hemorrhoids, discussed fiber, hydration and discussed colonosocpy      No orders of the defined types were placed in this encounter.   Follow-up: No follow-ups on file.    Forrest Moron, MD

## 2018-09-07 LAB — URINE CULTURE

## 2018-09-21 ENCOUNTER — Encounter: Payer: Self-pay | Admitting: General Surgery

## 2018-09-21 ENCOUNTER — Telehealth: Payer: Self-pay | Admitting: General Surgery

## 2018-09-21 NOTE — Telephone Encounter (Signed)
Left a voicemail for the patient to call to pre-screen for virtual visit.

## 2018-09-22 ENCOUNTER — Encounter: Payer: Self-pay | Admitting: Gastroenterology

## 2018-09-22 ENCOUNTER — Other Ambulatory Visit: Payer: Self-pay

## 2018-09-22 ENCOUNTER — Ambulatory Visit (INDEPENDENT_AMBULATORY_CARE_PROVIDER_SITE_OTHER): Payer: BC Managed Care – PPO | Admitting: Gastroenterology

## 2018-09-22 VITALS — Ht 65.0 in | Wt 180.0 lb

## 2018-09-22 DIAGNOSIS — K625 Hemorrhage of anus and rectum: Secondary | ICD-10-CM | POA: Diagnosis not present

## 2018-09-22 DIAGNOSIS — Z1211 Encounter for screening for malignant neoplasm of colon: Secondary | ICD-10-CM | POA: Diagnosis not present

## 2018-09-22 DIAGNOSIS — R159 Full incontinence of feces: Secondary | ICD-10-CM | POA: Diagnosis not present

## 2018-09-22 MED ORDER — SUPREP BOWEL PREP KIT 17.5-3.13-1.6 GM/177ML PO SOLN: ORAL | 0 refills | Status: DC

## 2018-09-22 NOTE — Patient Instructions (Signed)
If you are age 53 or older, your body mass index should be between 23-30. Your Body mass index is 29.95 kg/m. If this is out of the aforementioned range listed, please consider follow up with your Primary Care Provider.  If you are age 85 or younger, your body mass index should be between 19-25. Your Body mass index is 29.95 kg/m. If this is out of the aformentioned range listed, please consider follow up with your Primary Care Provider.   To help prevent the possible spread of infection to our patients, communities, and staff; we will be implementing the following measures:  As of now we are not allowing any visitors/family members to accompany you to any upcoming appointments with [facility]. If you have any concerns about this please contact our office to discuss prior to the appointment.   Please use a fiber supplement daily.  You have been scheduled for a colonoscopy. Please follow written instructions given to you at your visit today.  Please pick up your prep supplies at the pharmacy within the next 1-3 days. If you use inhalers (even only as needed), please bring them with you on the day of your procedure. Your physician has requested that you go to www.startemmi.com and enter the access code given to you at your visit today. This web site gives a general overview about your procedure. However, you should still follow specific instructions given to you by our office regarding your preparation for the procedure.   Thank you for entrusting me with your care and for choosing [facility], Dr. Toast Cellar

## 2018-09-22 NOTE — Progress Notes (Signed)
Virtual Visit via Video Note  I connected with Arnetha Courser on 09/22/18 at  2:00 PM EDT by a video enabled telemedicine application and verified that I am speaking with the correct person using two identifiers.  I discussed the limitations of evaluation and management by telemedicine and the availability of in person appointments. The patient expressed understanding and agreed to proceed.  THIS ENCOUNTER IS A VIRTUAL VISIT DUE TO COVID-19 - PATIENT WAS NOT SEEN IN THE OFFICE. PATIENT HAS CONSENTED TO VIRTUAL VISIT / TELEMEDICINE VISIT VIA [employer] APP   Location of patient: home Location of provider: office Name of referring provider: Delia Chimes MD Persons participating: myself, patient   HPI :  53 y/o female with a history of depression, ADHD, fecal incontinence referred by Delia Chimes for rectal bleeding and encopresis.  Patient has had some fecal incontinence for the past 18 months. She occasionally can't make it to the bathroom at times due to strong urge, and either leads to mild leakage to a full blown accident. She previously had daily symptoms which bothered her significantly, but more recently has been bothering her about once a week. She is having a bowel movement with each time she urinating which is unusual for her. Stools are soft and loose. She denies any urinary incontinence. She does not have blood in the stool but has some blood on the toilet paper when wiping, ongoing same amount of time. No abdominal pains. Weight has been stable. No nocturnal symptoms. Occurs sporadically. She denies any urinary incontinence. She denies any sensory deficits.   No new changes in medications. No NSAIDs. She is s/p appendectomy and cholecystectomy at age 8.   No FH of CRC, IBD, or celiac. No tobacco.   No prior colonoscopy  CT scan 04/25/17 - appendicitis    Past Medical History:  Diagnosis Date  . ADHD   . Depression   . Urinary incontinence      Past Surgical History:   Procedure Laterality Date  . APPENDECTOMY  04/2017  . CHOLECYSTECTOMY    . LAPAROSCOPIC APPENDECTOMY N/A 04/25/2017   Procedure: APPENDECTOMY LAPAROSCOPIC;  Surgeon: Greer Pickerel, MD;  Location: [facility];  Service: General;  Laterality: N/A;   Family History  Problem Relation Age of Onset  . Scoliosis Mother   . Osteoporosis Mother   . Hypertension Father   . Hypertension Maternal Grandmother   . Hodgkin's lymphoma Maternal Grandmother   . Colon cancer Neg Hx    Social History   Tobacco Use  . Smoking status: Never Smoker  . Smokeless tobacco: Never Used  Substance Use Topics  . Alcohol use: Yes    Alcohol/week: 2.0 - 3.0 standard drinks    Types: 2 - 3 Standard drinks or equivalent per week  . Drug use: No   Current Outpatient Medications  Medication Sig Dispense Refill  . acetaminophen (TYLENOL) 325 MG tablet You can take 2 tablets every 4-6 hours as needed for pain.  You can alternate with plain Tylenol or Ibuprofen, or the prescribed pain medicine.    DO NOT TAKE MORE THAN 4000 MG OF TYLENOL PER DAY.  IT CAN HARM YOUR LIVER.  TYLENOL (ACETAMINOPHEN) IS ALSO IN YOUR PRESCRIPTION PAIN MEDICATION.  YOU HAVE TO COUNT IT IN YOUR DAILY TOTAL.    Marland Kitchen ALPRAZolam (XANAX) 0.25 MG tablet Take 0.25 mg by mouth at bedtime as needed for anxiety.    Marland Kitchen amphetamine-dextroamphetamine (ADDERALL) 20 MG tablet Take 20 mg by mouth daily.    Marland Kitchen  escitalopram (LEXAPRO) 20 MG tablet Take 20 mg by mouth daily.  5  . fluticasone (FLONASE) 50 MCG/ACT nasal spray Place 2 sprays into both nostrils daily. 48 g 3  . HYDROcodone-acetaminophen (NORCO/VICODIN) 5-325 MG tablet Take 1-2 tablets by mouth every 4 (four) hours as needed for moderate pain or severe pain. 15 tablet 0  . ibuprofen (ADVIL,MOTRIN) 200 MG tablet You can take 2-3 tablets every 6 hours as needed for pain.  You can alternate with Tylenol or the prescribed pain medicine also.  You can buy this over the counter at any drug store.    .  lisdexamfetamine (VYVANSE) 60 MG capsule Take 60 mg by mouth every morning.    Marland Kitchen REXULTI 1 MG TABS Take 2 mg by mouth at bedtime.     Marland Kitchen SILENOR 3 MG TABS      No current facility-administered medications for this visit.    No Known Allergies   Review of Systems: All systems reviewed and negative except where noted in HPI.   Lab Results  Component Value Date   WBC 8.0 05/09/2017   HGB 12.9 05/09/2017   HCT 39.2 05/09/2017   MCV 83.1 05/09/2017   PLT 143 (L) 04/26/2017    Lab Results  Component Value Date   CREATININE 0.75 04/26/2017   BUN 9 04/26/2017   NA 137 04/26/2017   K 4.2 04/26/2017   CL 106 04/26/2017   CO2 22 04/26/2017    Lab Results  Component Value Date   ALT 15 03/10/2016   AST 23 03/10/2016   ALKPHOS 80 03/10/2016   BILITOT 0.5 03/10/2016    No results found for: TSH   Physical Exam: Ht 5' 5"  (1.651 m) Comment: pt provided over the phone  Wt 180 lb (81.6 kg) Comment: pt provided over the phone  BMI 29.95 kg/m  Constitutional: Pleasant,well-developed, female in no acute distress. Psychiatric: Normal mood and affect. Behavior is normal.   ASSESSMENT AND PLAN: 53 y/o female here for new patient assessment of the following:  Fecal incontinence / rectal bleeding / colon cancer screening - 18 months worth of fairly frequent fecal incontinence / leakage with soft / loose stools with urgency. No prior evaluation. She has also had some periodic superficial rectal bleeding. No prior colon cancer screening. I discussed ddx with her, recommend colonoscopy to further evaluate, ensure no colitis / microscopic colitis, check pelvic floor muscles as well. I discussed risks / benefits of colonoscopy and anesthesia with her and she wanted to proceed. In the interim recommend she use a daily fiber supplement to help bulk stools. She agreed to do this, further recommendations pending the results.   Wineglass Cellar, MD [facility]  CC: Forrest Moron, MD

## 2018-09-28 ENCOUNTER — Telehealth: Payer: Self-pay

## 2018-09-28 NOTE — Telephone Encounter (Signed)
LM for pt to call and let me know when she is coming in to be instructed for colon on Monday.

## 2018-09-29 ENCOUNTER — Telehealth: Payer: Self-pay | Admitting: Gastroenterology

## 2018-09-29 NOTE — Telephone Encounter (Signed)
No to all answers. °

## 2018-09-29 NOTE — Telephone Encounter (Signed)

## 2018-10-02 ENCOUNTER — Other Ambulatory Visit: Payer: Self-pay

## 2018-10-02 ENCOUNTER — Ambulatory Visit (AMBULATORY_SURGERY_CENTER): Payer: BC Managed Care – PPO | Admitting: Gastroenterology

## 2018-10-02 ENCOUNTER — Encounter: Payer: Self-pay | Admitting: Gastroenterology

## 2018-10-02 VITALS — BP 102/74 | HR 80 | Temp 99.1°F | Resp 16

## 2018-10-02 DIAGNOSIS — K5150 Left sided colitis without complications: Secondary | ICD-10-CM | POA: Diagnosis not present

## 2018-10-02 DIAGNOSIS — K529 Noninfective gastroenteritis and colitis, unspecified: Secondary | ICD-10-CM | POA: Diagnosis present

## 2018-10-02 DIAGNOSIS — R159 Full incontinence of feces: Secondary | ICD-10-CM

## 2018-10-02 MED ORDER — MESALAMINE 4 G RE ENEM: 4.0000 g | ENEMA | Freq: Every day | RECTAL | Status: DC

## 2018-10-02 MED ORDER — SODIUM CHLORIDE 0.9 % IV SOLN: 500.0000 mL | Freq: Once | INTRAVENOUS | Status: DC

## 2018-10-02 MED ORDER — MESALAMINE 1.2 G PO TBEC: 4.8000 g | DELAYED_RELEASE_TABLET | Freq: Every day | ORAL | 3 refills | Status: DC

## 2018-10-02 NOTE — Progress Notes (Signed)
Pt's states no medical or surgical changes since previsit or office visit. 

## 2018-10-02 NOTE — Progress Notes (Signed)
PT taken to PACU. Monitors in place. VSS. Report given to RN. 

## 2018-10-02 NOTE — Progress Notes (Signed)
Called to room to assist during endoscopic procedure.  Patient ID and intended procedure confirmed with present staff. Received instructions for my participation in the procedure from the performing physician.  

## 2018-10-02 NOTE — Op Note (Signed)
[facility] Patient Name: Samantha Barry Procedure Date: 10/02/2018 4:27 PM MRN: [id number] Endoscopist: Remo Lipps P. Havery Moros , MD Age: 53 Referring MD:  Date of Birth: [birth year] Gender: Female Account #: [id number] Procedure:                Colonoscopy Indications:              This is the patient's first colonoscopy, Chronic                            diarrhea, Fecal incontinence Medicines:                Monitored Anesthesia Care Procedure:                Pre-Anesthesia Assessment:                           - Prior to the procedure, a History and Physical                            was performed, and patient medications and                            allergies were reviewed. The patient's tolerance of                            previous anesthesia was also reviewed. The risks                            and benefits of the procedure and the sedation                            options and risks were discussed with the patient.                            All questions were answered, and informed consent                            was obtained. Prior Anticoagulants: The patient has                            taken no previous anticoagulant or antiplatelet                            agents. ASA Grade Assessment: II - A patient with                            mild systemic disease. After reviewing the risks                            and benefits, the patient was deemed in                            satisfactory condition to undergo the procedure.  After obtaining informed consent, the colonoscope                            was passed under direct vision. Throughout the                            procedure, the patient's blood pressure, pulse, and                            oxygen saturations were monitored continuously. The                            Colonoscope was introduced through the anus and                            advanced to the the terminal  ileum, with                            identification of the appendiceal orifice and IC                            valve. The colonoscopy was performed without                            difficulty. The patient tolerated the procedure                            well. The quality of the bowel preparation was                            fair. The terminal ileum, ileocecal valve,                            appendiceal orifice, and rectum were photographed. Scope In: 4:30:03 PM Scope Out: 4:44:40 PM Scope Withdrawal Time: 0 hours 10 minutes 19 seconds  Total Procedure Duration: 0 hours 14 minutes 37 seconds  Findings:                 The perianal and digital rectal examinations were                            normal.                           The terminal ileum appeared normal.                           Diffuse inflammation characterized by altered                            vascularity, erosions, erythema, friability and                            granularity was found in the rectum, in the sigmoid  colon, in the descending colon and in the cecal                            cap. Inflammation was moderate from the distal                            rectum through 30cm proximal to the dentate line,                            otherwise mild in the left colon. Suspect the                            patient may have left sided ulcerative colitis with                            cecal patch of inflammation, versus less likely                            Crohn's colitis. Biopsies were taken with a cold                            forceps for histology.                           A large amount of liquid stool was found in the                            entire colon, making visualization difficult.                            Lavage of the area was performed using copious                            amounts of sterile water, resulting in incomplete                             clearance with fair visualization.                           The exam was otherwise without abnormality. No                            large mass lesions or large polyps noted, but the                            prep may not have been adequate enough to rule out                            small or flat polyps in certain portions of the                            colon. Complications:            No immediate  complications. Estimated blood loss:                            Minimal. Estimated Blood Loss:     Estimated blood loss was minimal. Impression:               - Preparation of the colon was fair.                           - The examined portion of the ileum was normal.                           - Diffuse mild inflammation was found in the                            rectum, in the sigmoid colon, in the descending                            colon and in the cecal cap. Biopsied.                           - The examination was otherwise normal.                           As above, suspect the patient has left sided                            ulcerative colitis with cecal patch versus less                            likely Crohn's colitis causing symptoms. Recommendation:           - Patient has a contact number available for                            emergencies. The signs and symptoms of potential                            delayed complications were discussed with the                            patient. Return to normal activities tomorrow.                            Written discharge instructions were provided to the                            patient.                           - Resume previous diet.                           - Continue present medications.                           -  Await pathology results.                           - Start Lialda 4.8gm / day (1.2gm tablet x 4 per                            day)                           - Start Rowasa enemas qHS for 2 weeks                            - Avoid all NSAIDs                           - Further recommendations pending pathology results                            and course on regimen as outlined. Remo Lipps P. Armbruster, MD 10/02/2018 4:52:40 PM This report has been signed electronically.

## 2018-10-02 NOTE — Progress Notes (Signed)
Temperature taken by JM, CMA, VS taken by [facility], [place]

## 2018-10-02 NOTE — Patient Instructions (Signed)
Thank you for allowing [place] to care for you today!  Await pathology results.  Dr Havery Moros will be in contact once results final.  Resume previous diet and medications today.  Return to normal activities tomorrow.  New prescriptions (2) sent to your pharmacy.     YOU HAD AN ENDOSCOPIC PROCEDURE TODAY AT [facility]:   Refer to the procedure report that was given to you for any specific questions about what was found during the examination.  If the procedure report does not answer your questions, please call your gastroenterologist to clarify.  If you requested that your care partner not be given the details of your procedure findings, then the procedure report has been included in a sealed envelope for you to review at your convenience later.  YOU SHOULD EXPECT: Some feelings of bloating in the abdomen. Passage of more gas than usual.  Walking can help get rid of the air that was put into your GI tract during the procedure and reduce the bloating. If you had a lower endoscopy (such as a colonoscopy or flexible sigmoidoscopy) you may notice spotting of blood in your stool or on the toilet paper. If you underwent a bowel prep for your procedure, you may not have a normal bowel movement for a few days.  Please Note:  You might notice some irritation and congestion in your nose or some drainage.  This is from the oxygen used during your procedure.  There is no need for concern and it should clear up in a day or so.  SYMPTOMS TO REPORT IMMEDIATELY:   Following lower endoscopy (colonoscopy or flexible sigmoidoscopy):  Excessive amounts of blood in the stool  Significant tenderness or worsening of abdominal pains  Swelling of the abdomen that is new, acute  Fever of 100F or higher      For urgent or emergent issues, a gastroenterologist can be reached at any hour by calling [phone].   DIET:  We do recommend a small meal at first, but then you may proceed to your  regular diet.  Drink plenty of fluids but you should avoid alcoholic beverages for 24 hours.  ACTIVITY:  You should plan to take it easy for the rest of today and you should NOT DRIVE or use heavy machinery until tomorrow (because of the sedation medicines used during the test).    FOLLOW UP: Our staff will call the number listed on your records 48-72 hours following your procedure to check on you and address any questions or concerns that you may have regarding the information given to you following your procedure. If we do not reach you, we will leave a message.  We will attempt to reach you two times.  During this call, we will ask if you have developed any symptoms of COVID 19. If you develop any symptoms (ie: fever, flu-like symptoms, shortness of breath, cough etc.) before then, please call [phone].  If you test positive for Covid 19 in the 2 weeks post procedure, please call and report this information to [place].    If any biopsies were taken you will be contacted by phone or by letter within the next 1-3 weeks.  Please call us at [phone] if you have not heard about the biopsies in 3 weeks.    SIGNATURES/CONFIDENTIALITY: You and/or your care partner have signed paperwork which will be entered into your electronic medical record.  These signatures attest to the fact that that the information above on your  After Visit Summary has been reviewed and is understood.  Full responsibility of the confidentiality of this discharge information lies with you and/or your care-partner.

## 2018-10-03 ENCOUNTER — Telehealth: Payer: Self-pay | Admitting: Gastroenterology

## 2018-10-03 NOTE — Telephone Encounter (Signed)
Called and spoke to pt.  She said Lialda would cost over $140.  She will call her insurance company and see if there is another mesalamine they would cover better and will call me back.

## 2018-10-04 ENCOUNTER — Telehealth: Payer: Self-pay

## 2018-10-04 NOTE — Telephone Encounter (Signed)
Patient is calling back again with additional information regarding her medication

## 2018-10-04 NOTE — Telephone Encounter (Signed)
  Follow up Call-  Call back number 10/02/2018  Post procedure Call Back phone  # [phone]  Permission to leave phone message Yes  Some recent data might be hidden     Patient questions:  Do you have a fever, pain , or abdominal swelling? Yes.   Pain Score  0 *  Have you tolerated food without any problems? Yes.    Have you been able to return to your normal activities? Yes.    Do you have any questions about your discharge instructions: Diet   No. Medications  No. Follow up visit  No.  Do you have questions or concerns about your Care? No.  Actions: * If pain score is 4 or above: No action needed, pain <4.  1. Have you developed a fever since your procedure? no  2.   Have you had an respiratory symptoms (SOB or cough) since your procedure? no  3.   Have you tested positive for COVID 19 since your procedure no  4.   Have you had any family members/close contacts diagnosed with the COVID 19 since your procedure?  no   If yes to any of these questions please route to Joylene John, RN and Alphonsa Gin, Therapist, sports.

## 2018-10-04 NOTE — Telephone Encounter (Signed)
First post procedure follow up call, no answer 

## 2018-10-05 ENCOUNTER — Other Ambulatory Visit: Payer: Self-pay

## 2018-10-05 MED ORDER — BALSALAZIDE DISODIUM 750 MG PO CAPS: 2250.0000 mg | ORAL_CAPSULE | Freq: Three times a day (TID) | ORAL | 5 refills | Status: DC

## 2018-10-05 MED ORDER — MESALAMINE 4 G RE ENEM: 4.0000 g | ENEMA | Freq: Every day | RECTAL | 2 refills | Status: DC

## 2018-10-05 NOTE — Progress Notes (Unsigned)
Per result note from Dr. Havery Moros, scripts sent for rowasa and balsalazide. Lialda discontinued due to cost.  Will call pt to schedule OV when Sept schedule opens

## 2018-10-05 NOTE — Telephone Encounter (Signed)
See result note from Pathology of colon.  Scripts sent for balsalazide and rowasa. Lialda discontinued. Pt needs OV when Sept schedule opens for Armbruster

## 2018-10-06 ENCOUNTER — Telehealth: Payer: Self-pay

## 2018-10-06 NOTE — Telephone Encounter (Signed)
Called and LM for pt to call back to be schedule for an OV with Armbruster the first week of September

## 2018-10-06 NOTE — Telephone Encounter (Signed)
Called pt. No answer °

## 2018-10-06 NOTE — Telephone Encounter (Signed)
-----   Message from [name], [place] sent at 10/05/2018  4:06 PM EDT ----- Regarding: schedule office visit for 1st week in Sept [place] for pt for 1st week of Sept - left sided UC with cecal patch - colon on 7-27

## 2018-10-09 NOTE — Telephone Encounter (Signed)
OV scheduled on 9/3 at 3:20pm with Dr. Havery Moros.

## 2018-11-09 ENCOUNTER — Ambulatory Visit: Payer: BC Managed Care – PPO | Admitting: Gastroenterology

## 2018-11-09 ENCOUNTER — Encounter: Payer: Self-pay | Admitting: Gastroenterology

## 2018-11-09 ENCOUNTER — Other Ambulatory Visit: Payer: Self-pay

## 2018-11-09 VITALS — BP 104/70 | HR 100 | Temp 97.9°F | Ht 65.0 in | Wt 185.2 lb

## 2018-11-09 DIAGNOSIS — K5100 Ulcerative (chronic) pancolitis without complications: Secondary | ICD-10-CM | POA: Diagnosis not present

## 2018-11-09 MED ORDER — BALSALAZIDE DISODIUM 750 MG PO CAPS: 1500.0000 mg | ORAL_CAPSULE | Freq: Two times a day (BID) | ORAL | 5 refills | Status: DC

## 2018-11-09 NOTE — Progress Notes (Signed)
HPI :  53 y/o female here for a follow up visit. She was seen by [place] in July for bowel habit changes. She underwent a colonoscopy on July 27th which showed colitis of the left colon up to 30cm from the anal verge, otherwise fair prep and no other significant inflammatory changes. Biopsies showed chronic active inflammation, moderate to severe in the left colon, mild in the right colon. I had recommended Rowasa and Lialda to treat this initially. She could not afford the Lialda. I had recommended Balsalazide as her insurance covered it, but she never picked it up. She did take the rowasa and used it daily for 2 weeks. It provided significant benefit to her symptoms. The Rowasa alone stopped the rectal bleeding and incontinence. She has had some loose stools periodically but nothing like she had before. She denies any NSAIDs. In total she thinks she has had symptoms for 18 months or so. This was her first colonoscopy.   Colonoscopy 10/02/18 - The perianal and digital rectal examinations were normal. - The terminal ileum appeared normal. - Diffuse inflammation characterized by altered vascularity, erosions, erythema, friability and granularity was found in the rectum, in the sigmoid colon, in the descending colon and in the cecal cap. Inflammation was moderate from the distal rectum through 30cm proximal to the dentate line, otherwise mild in the left colon. Suspect the patient may have left sided ulcerative colitis with cecal patch of inflammation, versus less likely Crohn's colitis. Biopsies were taken with a cold forceps for histology. - A large amount of liquid stool was found in the entire colon, making visualization difficult. Lavage of the area was performed using copious amounts of sterile water, resulting in incomplete clearance with fair visualization. - The exam was otherwise without abnormality. No large mass lesions or large polyps noted, but the prep may not have been adequate enough to  rule out small or flat polyps in certain portions of the colon.   Past Medical History:  Diagnosis Date   ADHD    Depression    Ulcerative colitis ([place])    Urinary incontinence      Past Surgical History:  Procedure Laterality Date   CHOLECYSTECTOMY     LAPAROSCOPIC APPENDECTOMY N/A 04/25/2017   Procedure: APPENDECTOMY LAPAROSCOPIC;  Surgeon: Greer Pickerel, MD;  Location: [facility];  Service: General;  Laterality: N/A;   Family History  Problem Relation Age of Onset   Scoliosis Mother    Osteoporosis Mother    Hypertension Father    Hypertension Maternal Grandmother    Hodgkin's lymphoma Maternal Grandmother    Colon cancer Neg Hx    Social History   Tobacco Use   Smoking status: Never Smoker   Smokeless tobacco: Never Used  Substance Use Topics   Alcohol use: Yes    Alcohol/week: 2.0 - 3.0 standard drinks    Types: 2 - 3 Standard drinks or equivalent per week   Drug use: No   Current Outpatient Medications  Medication Sig Dispense Refill   acetaminophen (TYLENOL) 325 MG tablet You can take 2 tablets every 4-6 hours as needed for pain.  You can alternate with plain Tylenol or Ibuprofen, or the prescribed pain medicine.    DO NOT TAKE MORE THAN 4000 MG OF TYLENOL PER DAY.  IT CAN HARM YOUR LIVER.  TYLENOL (ACETAMINOPHEN) IS ALSO IN YOUR PRESCRIPTION PAIN MEDICATION.  YOU HAVE TO COUNT IT IN YOUR DAILY TOTAL.     ALPRAZolam (XANAX) 0.25 MG tablet Take 0.25  mg by mouth at bedtime as needed for anxiety.     amphetamine-dextroamphetamine (ADDERALL) 20 MG tablet Take 20 mg by mouth daily.     escitalopram (LEXAPRO) 20 MG tablet Take 20 mg by mouth daily.  5   fluticasone (FLONASE) 50 MCG/ACT nasal spray Place 2 sprays into both nostrils daily. 48 g 3   HYDROcodone-acetaminophen (NORCO/VICODIN) 5-325 MG tablet Take 1-2 tablets by mouth every 4 (four) hours as needed for moderate pain or severe pain. 15 tablet 0   ibuprofen (ADVIL,MOTRIN) 200 MG tablet You  can take 2-3 tablets every 6 hours as needed for pain.  You can alternate with Tylenol or the prescribed pain medicine also.  You can buy this over the counter at any drug store.     lisdexamfetamine (VYVANSE) 60 MG capsule Take 60 mg by mouth every morning.     mesalamine (ROWASA) 4 g enema Place 60 mLs (4 g total) rectally at bedtime. For two weeks and then as needed 30 enema 2   REXULTI 1 MG TABS Take 2 mg by mouth at bedtime.      SILENOR 3 MG TABS      balsalazide (COLAZAL) 750 MG capsule Take 3 capsules (2,250 mg total) by mouth 3 (three) times daily. (Patient not taking: Reported on 11/09/2018) 270 capsule 5   No current facility-administered medications for this visit.    No Known Allergies   Review of Systems: All systems reviewed and negative except where noted in HPI.   Lab Results  Component Value Date   WBC 8.0 05/09/2017   HGB 12.9 05/09/2017   HCT 39.2 05/09/2017   MCV 83.1 05/09/2017   PLT 143 (L) 04/26/2017    Lab Results  Component Value Date   CREATININE 0.75 04/26/2017   BUN 9 04/26/2017   NA 137 04/26/2017   K 4.2 04/26/2017   CL 106 04/26/2017   CO2 22 04/26/2017    Lab Results  Component Value Date   ALT 15 03/10/2016   AST 23 03/10/2016   ALKPHOS 80 03/10/2016   BILITOT 0.5 03/10/2016     Physical Exam: BP 104/70    Pulse 100    Temp 97.9 F (36.6 C)    Ht 5' 5" (1.651 m)    Wt 185 lb 3.2 oz (84 kg)    BMI 30.82 kg/m  Constitutional: Pleasant,well-developed, female in no acute distress. HEENT: Normocephalic and atraumatic. Conjunctivae are normal. No scleral icterus. Neck supple.  Cardiovascular: Normal rate, regular rhythm.  Pulmonary/chest: Effort normal and breath sounds normal. No wheezing, rales or rhonchi. Abdominal: Soft, nondistended, nontender.  There are no masses palpable. No hepatomegaly. Extremities: no edema Lymphadenopathy: No cervical adenopathy noted. Neurological: Alert and oriented to person place and time. Skin:  Skin is warm and dry. No rashes noted. Psychiatric: Normal mood and affect. Behavior is normal.   ASSESSMENT AND PLAN: 53 y/o female here for reassessment of the following issues:  Ulcerative pancolitis - newly diagnosed in July, I suspect she's had this for over a year and a half based on symptoms. Recommended oral and rectal mesalamine initially. She could not afford Lialda, her insurance covered balsalazide however she never picked it up. Used only Rowasa for 2 weeks which provided significant improvement. She has since stopped it, only mild symptoms at this time. We discussed UC in general, what it is, long term risks, management options. Looks like based on path she has pancolitis, but it was worst in the left colon.  I explained to her that she warrants maintenance therapy indefinitely at this point to keep symptoms controlled and avoid flares. If she can afford and tolerate the balsalazide, recommend she do this and will dose at 2.25 g twice daily, as this appears to be the most affordable option for her from her insurance information.  I will also refill her Rowasa and she can use this every other day for the next week or 2 and attempt to put her in remission and then moving forward use it as needed.  Hopefully she can maintain remission with oral balsalazide over time.  If she cannot and has continuing flares then we will consider other options for treatment but our hope is that this works well for her.  I would like her to have a CBC and C met in 1 month, and we will see her in clinic in 3 to 4 months for reassessment.  If she does well with this we will plan on another colonoscopy within 6 months to ensure mucosal healing and to ensure he is she has had an adequate screening exam given fair prep noted on her last exam. She should continue to avoid all NSAIDs, can use tylenol PRN for aches / pains.  [place] Cellar, MD [facility]

## 2018-11-09 NOTE — Patient Instructions (Addendum)
If you are age 53 or older, your body mass index should be between 23-30. Your Body mass index is 30.82 kg/m. If this is out of the aforementioned range listed, please consider follow up with your Primary Care Provider.  If you are age 34 or younger, your body mass index should be between 19-25. Your Body mass index is 30.82 kg/m. If this is out of the aformentioned range listed, please consider follow up with your Primary Care Provider.   To help prevent the possible spread of infection to our patients, communities, and staff; we will be implementing the following measures:  As of now we are not allowing any visitors/family members to accompany you to any upcoming appointments with [facility]. If you have any concerns about this please contact our office to discuss prior to the appointment.   Resume your Rowasa enemas.  We have sent the following medications to your pharmacy for you to pick up at your convenience: Balsalazide 7101m: Take 2 tablets 2 times a day  We would like to see you back in the office in 4 months.  We will remind you when it is time to schedule this appointment.  Thank you for entrusting me with your care and for choosing [facility] Dr. [place] Cellar   .

## 2018-12-04 ENCOUNTER — Telehealth: Payer: Self-pay

## 2018-12-04 NOTE — Telephone Encounter (Signed)
Called pt and left message to go to the lab. Mailed letter as well.

## 2018-12-04 NOTE — Telephone Encounter (Signed)
-----   Message from [name], [place] sent at 11/09/2018  4:04 PM EDT ----- Regarding: labs due Cbc and cmet due in early October for UC

## 2018-12-12 ENCOUNTER — Other Ambulatory Visit (INDEPENDENT_AMBULATORY_CARE_PROVIDER_SITE_OTHER): Payer: BC Managed Care – PPO

## 2018-12-12 DIAGNOSIS — K5100 Ulcerative (chronic) pancolitis without complications: Secondary | ICD-10-CM

## 2018-12-12 LAB — COMPREHENSIVE METABOLIC PANEL
ALT: 9 U/L (ref 0–35)
AST: 16 U/L (ref 0–37)
Albumin: 4.2 g/dL (ref 3.5–5.2)
Alkaline Phosphatase: 81 U/L (ref 39–117)
BUN: 12 mg/dL (ref 6–23)
CO2: 27 mEq/L (ref 19–32)
Calcium: 9.5 mg/dL (ref 8.4–10.5)
Chloride: 103 mEq/L (ref 96–112)
Creatinine, Ser: 0.86 mg/dL (ref 0.40–1.20)
GFR: 68.92 mL/min (ref >60.00)
Glucose, Bld: 121 mg/dL — ABNORMAL HIGH (ref 70–99)
Potassium: 4.1 mEq/L (ref 3.5–5.1)
Sodium: 137 mEq/L (ref 135–145)
Total Bilirubin: 0.5 mg/dL (ref 0.2–1.2)
Total Protein: 7.9 g/dL (ref 6.0–8.3)

## 2018-12-12 LAB — CBC WITH DIFFERENTIAL/PLATELET
Basophils Absolute: 0.1 10*3/uL (ref 0.0–0.1)
Basophils Relative: 1.7 % (ref 0.0–3.0)
Eosinophils Absolute: 0.3 10*3/uL (ref 0.0–0.7)
Eosinophils Relative: 5.1 % — ABNORMAL HIGH (ref 0.0–5.0)
HCT: 39.8 % (ref 36.0–46.0)
Hemoglobin: 12.9 g/dL (ref 12.0–15.0)
Lymphocytes Relative: 27.5 % (ref 12.0–46.0)
Lymphs Abs: 1.9 10*3/uL (ref 0.7–4.0)
MCHC: 32.4 g/dL (ref 30.0–36.0)
MCV: 84.6 fl (ref 78.0–100.0)
Monocytes Absolute: 0.7 10*3/uL (ref 0.1–1.0)
Monocytes Relative: 10.9 % (ref 3.0–12.0)
Neutro Abs: 3.7 10*3/uL (ref 1.4–7.7)
Neutrophils Relative %: 54.8 % (ref 43.0–77.0)
Platelets: 258 10*3/uL (ref 150.0–400.0)
RBC: 4.71 Mil/uL (ref 3.87–5.11)
RDW: 14.2 % (ref 11.5–15.5)
WBC: 6.8 10*3/uL (ref 4.0–10.5)

## 2019-05-06 ENCOUNTER — Other Ambulatory Visit: Payer: Self-pay | Admitting: Gastroenterology

## 2019-08-06 IMAGING — CT CT ABD-PELV W/ CM
2 of 5 series · 15 of 46 positions shown, 17 images · IV contrast (APPLIED)
Comparison: None.

CLINICAL DATA: Right lower quadrant abdominal pain since last
night. Prior cholecystectomy.

EXAM:
CT ABDOMEN AND PELVIS WITH CONTRAST
TECHNIQUE: Multidetector CT imaging of the abdomen and pelvis was performed
using the standard protocol following bolus administration of
intravenous contrast.
CONTRAST:  30mL CQTVF5-NUU IOPAMIDOL (CQTVF5-NUU) INJECTION 61%,
100mL CQTVF5-NUU IOPAMIDOL (CQTVF5-NUU) INJECTION 61%

[Series 2: axial st · axial · 0.70mm/px · z∈[-628,-242]mm · 12 of 89 slices shown, 14 images]
[im 6/89  soft-tissue]
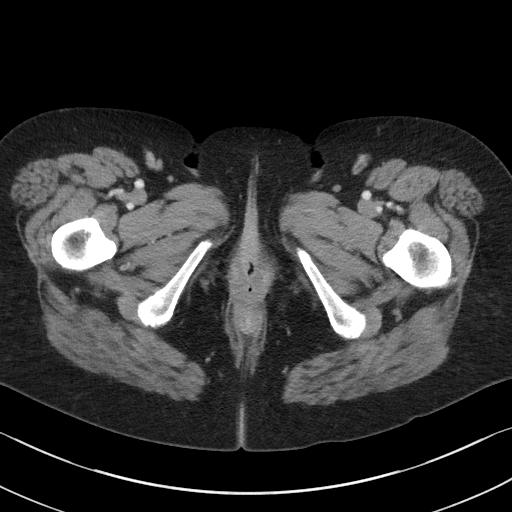
[im 6/89  bone]
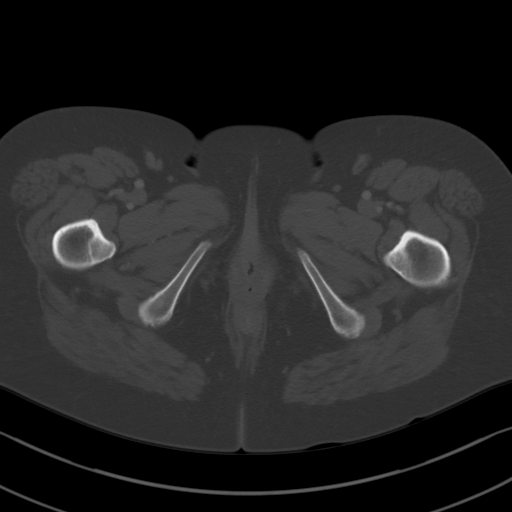
[im 12/89  soft-tissue]
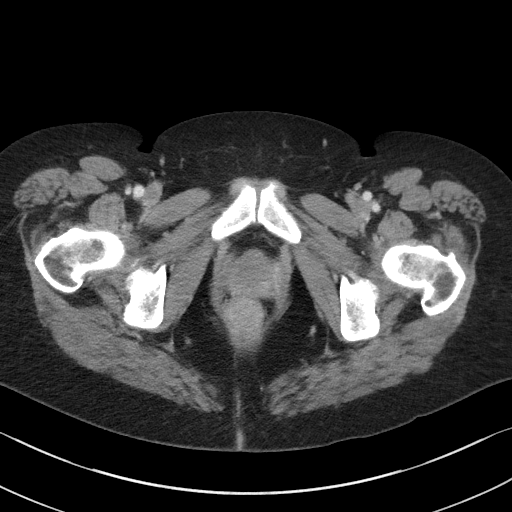
[im 18/89  soft-tissue]
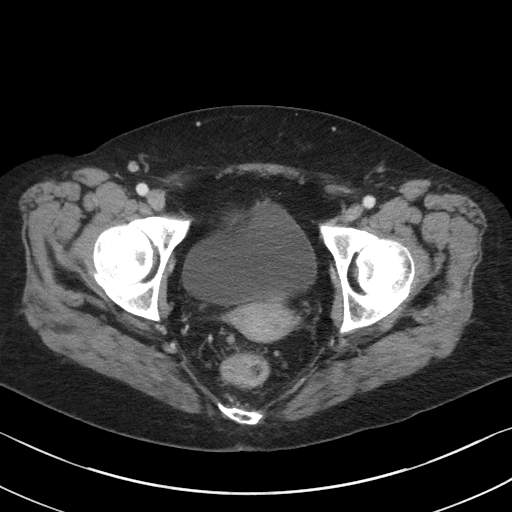
[im 30/89  soft-tissue]
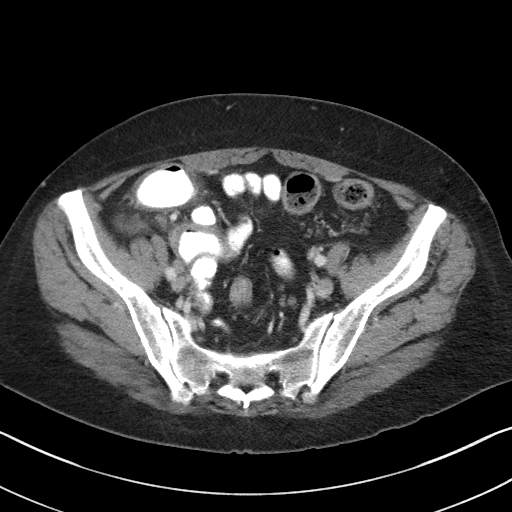
[im 36/89  soft-tissue]
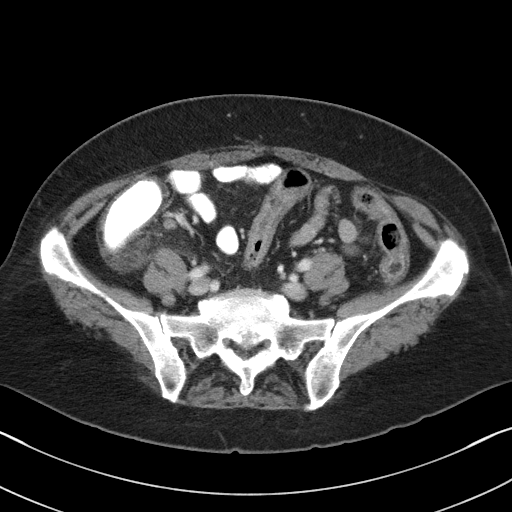
[im 42/89  soft-tissue]
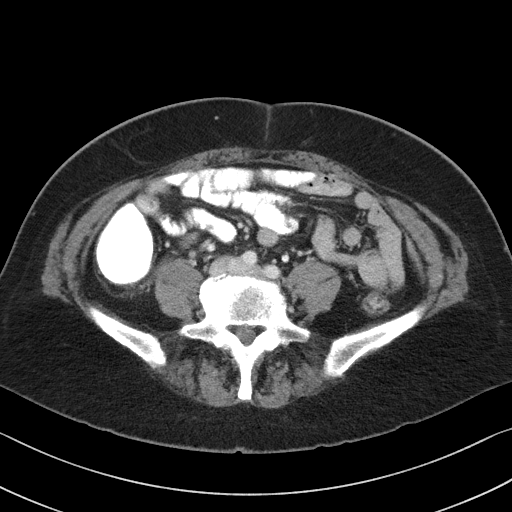
[im 47/89  soft-tissue]
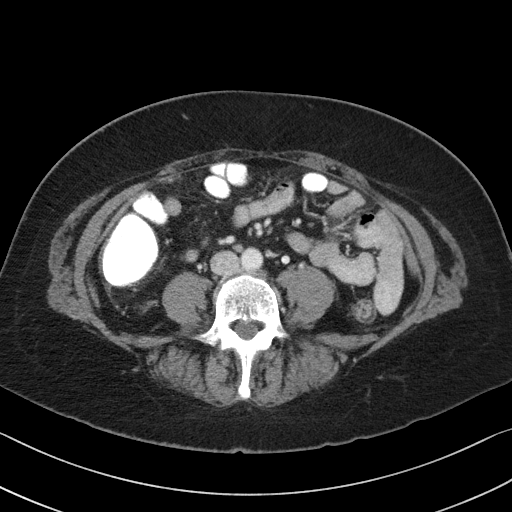
[im 53/89  soft-tissue]
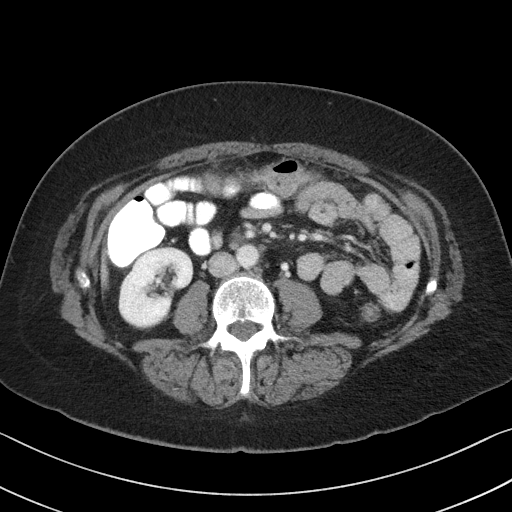
[im 59/89  soft-tissue]
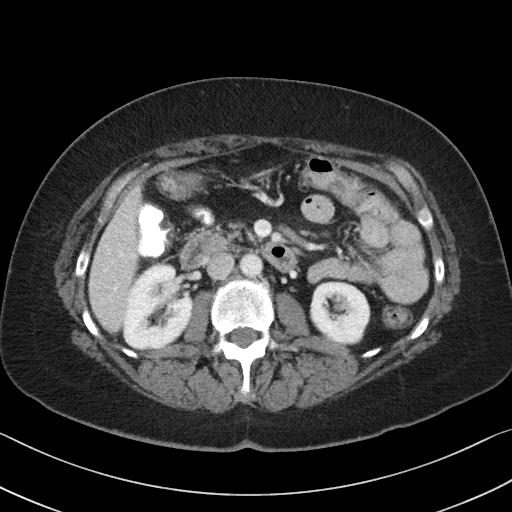
[im 59/89  bone]
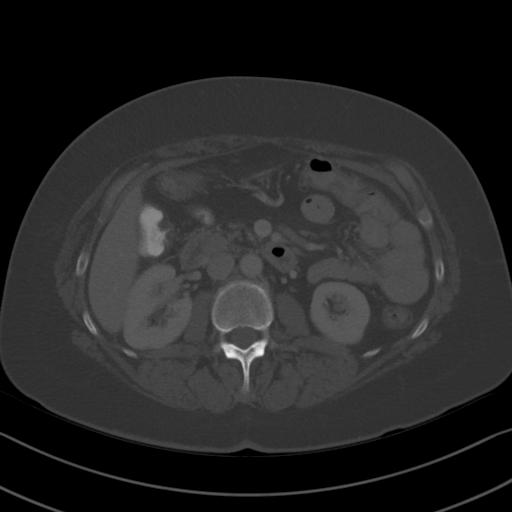
[im 71/89  soft-tissue]
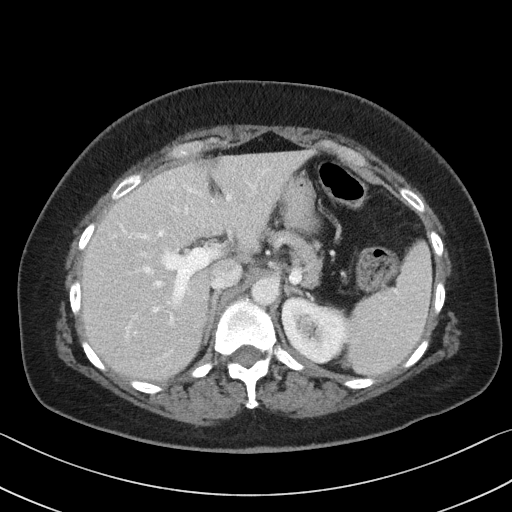
[im 77/89  soft-tissue]
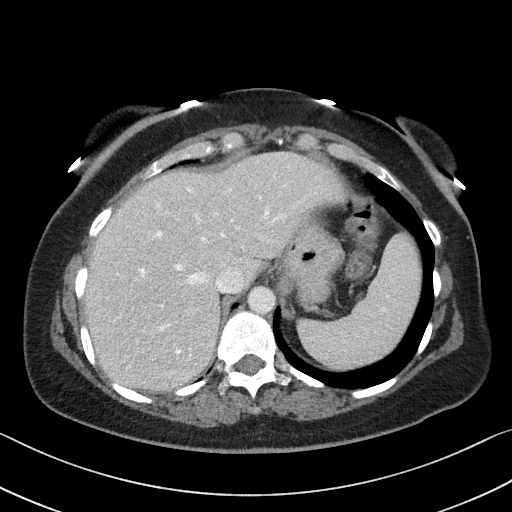
[im 83/89  soft-tissue]
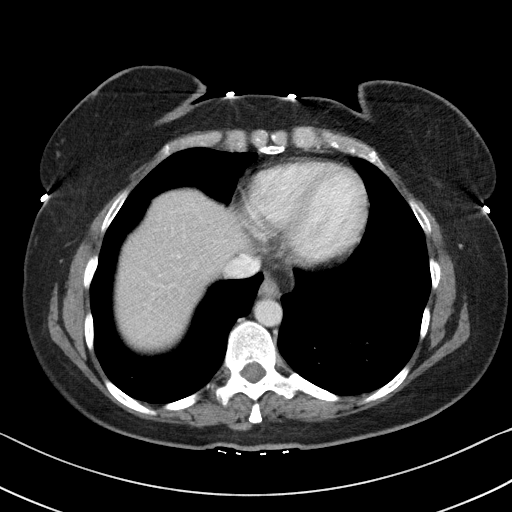

[Series 5: coronal st · coronal · 0.69mm/px · 3 of 93 slices shown]
[im 31/93  soft-tissue]
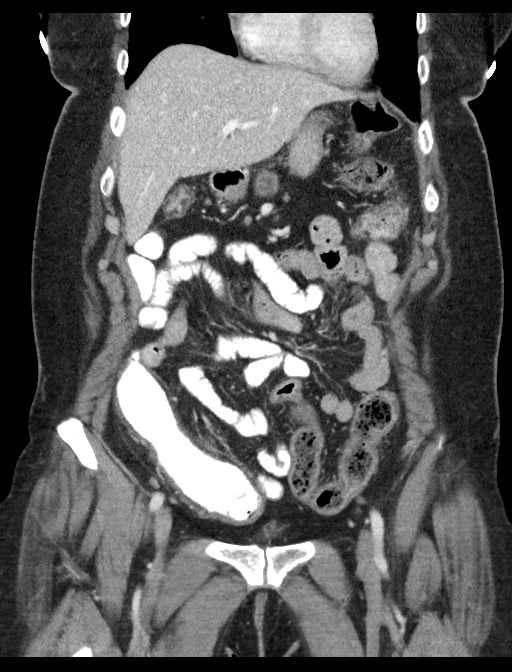
[im 41/93  soft-tissue]
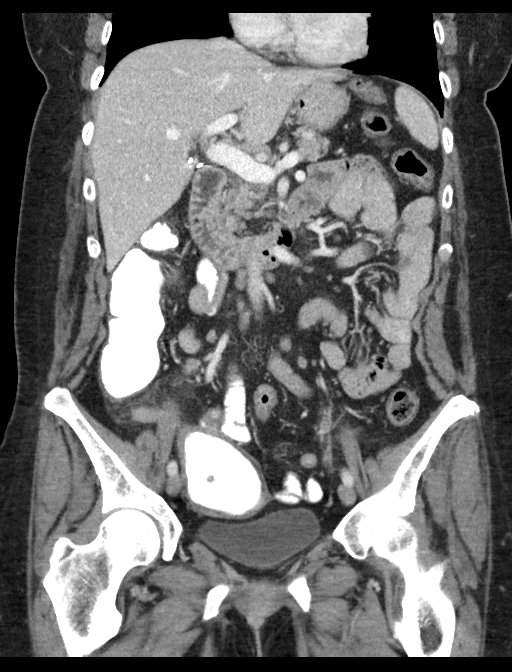
[im 52/93  soft-tissue]
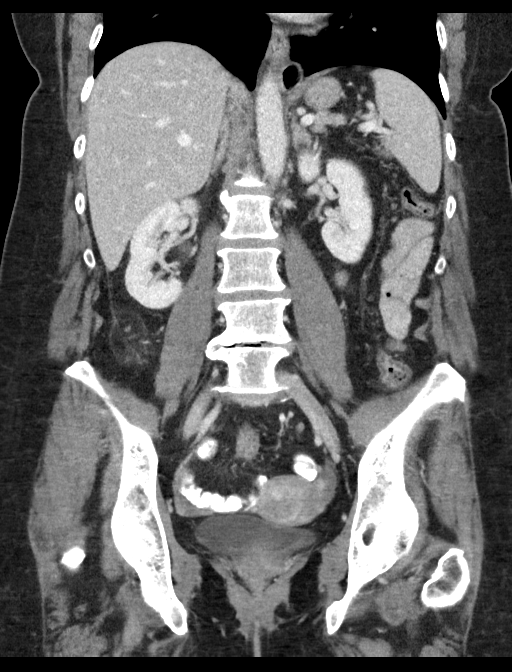

[15 of 46 positions shown; findings below may reference images not displayed]

FINDINGS: Lower chest: No significant pulmonary nodules or acute consolidative
airspace disease.

Hepatobiliary: Normal liver size. No liver mass. Cholecystectomy. No
biliary ductal dilatation.

Pancreas: Normal, with no mass or duct dilation.

Spleen: Normal size. No mass.

Adrenals/Urinary Tract: Normal adrenals. Normal kidneys with no
hydronephrosis and no renal mass. Normal bladder.

Stomach/Bowel: Small hiatal hernia. Otherwise normal nondistended
stomach. Normal caliber small bowel with no small bowel wall
thickening. The appendix is dilated and thick walled with prominent
periappendiceal fat stranding, compatible with acute appendicitis.

Appendix: Location: Right lower quadrant

Diameter: 11 mm

Appendicolith: Not present

Mucosal hyper-enhancement: Present

Extraluminal gas: Not present

Periappendiceal collection: Not present

Mild wall thickening in the cecum is probably reactive. Transverse
and left colon are relatively collapsed. Oral contrast reaches the
hepatic flexure.

Vascular/Lymphatic: Normal caliber abdominal aorta. Patent portal,
splenic, hepatic and renal veins. Mildly enlarged right lower
quadrant mesenteric nodes, largest 1.2 cm (series 2/image 50).

Reproductive: Uterus is heterogeneous, which may indicate small
fibroids. No adnexal masses.

Other: No pneumoperitoneum, ascites or focal fluid collection.

Musculoskeletal: No aggressive appearing focal osseous lesions. Non
expansile sclerotic anterior right seventh rib lesion, nonspecific,
probably a benign bone island. Moderate lumbar spondylosis.
IMPRESSION: 1. Acute appendicitis. No free air. No abscess. No appendicoliths.
2. Mild cecal wall thickening, probably reactive.
3. Mild right lower quadrant mesenteric adenopathy, probably
reactive.
4. Small hiatal hernia.
5. Heterogeneous uterus, nonspecific, which may indicate small
fibroids. Outpatient pelvic sonography correlation may be obtained
as clinically warranted.

These results were called by telephone at the time of interpretation
on 04/25/2017 at [DATE] to Dr. JAE HWAN INZUNZA , who verbally
acknowledged these results.

## 2019-11-05 ENCOUNTER — Other Ambulatory Visit: Payer: Self-pay | Admitting: Gastroenterology

## 2019-12-02 ENCOUNTER — Other Ambulatory Visit: Payer: Self-pay | Admitting: Gastroenterology

## 2019-12-06 ENCOUNTER — Other Ambulatory Visit: Payer: Self-pay | Admitting: Gastroenterology

## 2019-12-25 ENCOUNTER — Ambulatory Visit: Payer: BC Managed Care – PPO | Admitting: Gastroenterology

## 2019-12-25 ENCOUNTER — Other Ambulatory Visit (INDEPENDENT_AMBULATORY_CARE_PROVIDER_SITE_OTHER): Payer: BC Managed Care – PPO

## 2019-12-25 ENCOUNTER — Encounter: Payer: Self-pay | Admitting: Gastroenterology

## 2019-12-25 VITALS — BP 122/76 | HR 106 | Ht 65.0 in | Wt 196.0 lb

## 2019-12-25 DIAGNOSIS — K219 Gastro-esophageal reflux disease without esophagitis: Secondary | ICD-10-CM

## 2019-12-25 DIAGNOSIS — K5100 Ulcerative (chronic) pancolitis without complications: Secondary | ICD-10-CM | POA: Diagnosis not present

## 2019-12-25 LAB — CBC WITH DIFFERENTIAL/PLATELET
Basophils Absolute: 0.1 10*3/uL (ref 0.0–0.1)
Basophils Relative: 1.3 % (ref 0.0–3.0)
Eosinophils Absolute: 0.3 10*3/uL (ref 0.0–0.7)
Eosinophils Relative: 4.3 % (ref 0.0–5.0)
HCT: 39.1 % (ref 36.0–46.0)
Hemoglobin: 13.1 g/dL (ref 12.0–15.0)
Lymphocytes Relative: 30.6 % (ref 12.0–46.0)
Lymphs Abs: 1.9 10*3/uL (ref 0.7–4.0)
MCHC: 33.6 g/dL (ref 30.0–36.0)
MCV: 86.5 fl (ref 78.0–100.0)
Monocytes Absolute: 0.7 10*3/uL (ref 0.1–1.0)
Monocytes Relative: 11.4 % (ref 3.0–12.0)
Neutro Abs: 3.3 10*3/uL (ref 1.4–7.7)
Neutrophils Relative %: 52.4 % (ref 43.0–77.0)
Platelets: 233 10*3/uL (ref 150.0–400.0)
RBC: 4.52 Mil/uL (ref 3.87–5.11)
RDW: 13.5 % (ref 11.5–15.5)
WBC: 6.2 10*3/uL (ref 4.0–10.5)

## 2019-12-25 LAB — COMPREHENSIVE METABOLIC PANEL
ALT: 11 U/L (ref 0–35)
AST: 27 U/L (ref 0–37)
Albumin: 4.2 g/dL (ref 3.5–5.2)
Alkaline Phosphatase: 71 U/L (ref 39–117)
BUN: 13 mg/dL (ref 6–23)
CO2: 29 mEq/L (ref 19–32)
Calcium: 9.3 mg/dL (ref 8.4–10.5)
Chloride: 102 mEq/L (ref 96–112)
Creatinine, Ser: 0.8 mg/dL (ref 0.40–1.20)
GFR: 83.34 mL/min (ref >60.00)
Glucose, Bld: 124 mg/dL — ABNORMAL HIGH (ref 70–99)
Potassium: 4 mEq/L (ref 3.5–5.1)
Sodium: 137 mEq/L (ref 135–145)
Total Bilirubin: 0.4 mg/dL (ref 0.2–1.2)
Total Protein: 7.6 g/dL (ref 6.0–8.3)

## 2019-12-25 MED ORDER — BALSALAZIDE DISODIUM 750 MG PO CAPS: 1500.0000 mg | ORAL_CAPSULE | Freq: Two times a day (BID) | ORAL | 1 refills | Status: DC

## 2019-12-25 MED ORDER — FAMOTIDINE 20 MG PO TABS: 20.0000 mg | ORAL_TABLET | ORAL | Status: AC | PRN

## 2019-12-25 NOTE — Progress Notes (Signed)
HPI :  54 y/o female here for a follow up visit for colitis. She was seen by in July 2020 for bowel habit changes. She underwent a colonoscopy 09/2018 which showed colitis of the left colon up to 30cm from the anal verge, otherwise fair prep and no other significant inflammatory changes. Biopsies showed chronic active inflammation, moderate to severe in the left colon, mild in the right colon. I had recommended Rowasa and Lialda to treat this initially. She could not afford the Lialda. I had recommended Balsalazide as her insurance covered it.  She eventually was able to get the balsalazide, taking 1.5 g twice daily for maintenance dosing.  She is tolerating it well.  She initially took Rowasa for a few weeks and then stopped.  I have not seen her since September 2020.  She had follow-up labs in October which showed stability on her medication regimen.  She states for the most part she is been doing really well.  Her bowels are typically regular, occasionally has a loose stool.  No blood in her stools.  No abdominal pains.  She has had some reflux symptoms that have been bothering her more since she has gained weight over the past year.  She takes Tums occasionally which helps.  She has some pyrosis which bothers her a few times per week.  She denies any dysphagia.  No nausea or vomiting.  She is eating well.  She denies any NSAID use.  She inquires about management of reflux.  Colonoscopy 10/02/18 - The perianal and digital rectal examinations were normal. - The terminal ileum appeared normal. - Diffuse inflammation characterized by altered vascularity, erosions, erythema, friability and granularity was found in the rectum, in the sigmoid colon, in the descending colon and in the cecal cap. Inflammation was moderate from the distal rectum through 30cm proximal to the dentate line, otherwise mild in the left colon. Suspect the patient may have left sided ulcerative colitis with cecal patch of  inflammation, versus less likely Crohn's colitis. Biopsies were taken with a cold forceps for histology. - A large amount of liquid stool was found in the entire colon, making visualization difficult. Lavage of the area was performed using copious amounts of sterile water, resulting in incomplete clearance with fair visualization. - The exam was otherwise without abnormality. No large mass lesions or large polyps noted, but the prep may not have been adequate enough to rule out small or flat polyps in certain portions of the colon.     Past Medical History:  Diagnosis Date  . ADHD   . Depression   . Ulcerative colitis ([place])   . Urinary incontinence      Past Surgical History:  Procedure Laterality Date  . CHOLECYSTECTOMY    . LAPAROSCOPIC APPENDECTOMY N/A 04/25/2017   Procedure: APPENDECTOMY LAPAROSCOPIC;  Surgeon: Greer Pickerel, MD;  Location: [facility];  Service: General;  Laterality: N/A;  . MOUTH SURGERY     Family History  Problem Relation Age of Onset  . Scoliosis Mother   . Osteoporosis Mother   . Hypertension Father   . Hypertension Maternal Grandmother   . Hodgkin's lymphoma Maternal Grandmother   . Colon cancer Neg Hx   . Stomach cancer Neg Hx   . Esophageal cancer Neg Hx   . Liver disease Neg Hx   . Pancreatic cancer Neg Hx    Social History   Tobacco Use  . Smoking status: Never Smoker  . Smokeless tobacco: Never Used  Vaping Use  .  Vaping Use: Never used  Substance Use Topics  . Alcohol use: Yes    Comment: occ  . Drug use: No   Current Outpatient Medications  Medication Sig Dispense Refill  . acetaminophen (TYLENOL) 325 MG tablet You can take 2 tablets every 4-6 hours as needed for pain.  You can alternate with plain Tylenol or Ibuprofen, or the prescribed pain medicine.    DO NOT TAKE MORE THAN 4000 MG OF TYLENOL PER DAY.  IT CAN HARM YOUR LIVER.  TYLENOL (ACETAMINOPHEN) IS ALSO IN YOUR PRESCRIPTION PAIN MEDICATION.  YOU HAVE TO COUNT IT IN YOUR  DAILY TOTAL.    Marland Kitchen ALPRAZolam (XANAX) 0.25 MG tablet Take 0.25 mg by mouth at bedtime as needed for anxiety.    Marland Kitchen amphetamine-dextroamphetamine (ADDERALL) 20 MG tablet Take 20 mg by mouth daily.    . balsalazide (COLAZAL) 750 MG capsule TAKE 2 CAPSULES BY MOUTH 2 TIMES DAILY. PLEASE KEEP YOUR 10/19 APPT WITH DR. Havery Moros FOR REFILLS. 120 capsule 0  . escitalopram (LEXAPRO) 20 MG tablet Take 20 mg by mouth daily.  5  . fluticasone (FLONASE) 50 MCG/ACT nasal spray Place 2 sprays into both nostrils daily. (Patient taking differently: Place 2 sprays into both nostrils daily as needed. ) 48 g 3  . lisdexamfetamine (VYVANSE) 60 MG capsule Take 60 mg by mouth every morning.    . mesalamine (ROWASA) 4 g enema Place 60 mLs (4 g total) rectally at bedtime. For two weeks and then as needed (Patient taking differently: Place 4 g rectally as needed. ) 30 enema 2  . REXULTI 1 MG TABS Take 2 mg by mouth at bedtime.     Marland Kitchen SILENOR 3 MG TABS Take 3 mg by mouth as needed.      No current facility-administered medications for this visit.   No Known Allergies   Review of Systems: All systems reviewed and negative except where noted in HPI.   Lab Results  Component Value Date   WBC 6.8 12/12/2018   HGB 12.9 12/12/2018   HCT 39.8 12/12/2018   MCV 84.6 12/12/2018   PLT 258.0 12/12/2018    Lab Results  Component Value Date   CREATININE 0.86 12/12/2018   BUN 12 12/12/2018   NA 137 12/12/2018   K 4.1 12/12/2018   CL 103 12/12/2018   CO2 27 12/12/2018    Lab Results  Component Value Date   ALT 9 12/12/2018   AST 16 12/12/2018   ALKPHOS 81 12/12/2018   BILITOT 0.5 12/12/2018     Physical Exam: BP 122/76   Pulse (!) 106   Ht _0  (1.651 m)   Wt 196 lb (88.9 kg)   LMP  (LMP Unknown)   SpO2 95%   BMI 32.62 kg/m  Constitutional: Pleasant,well-developed, female in no acute distress. Neurological: Alert and oriented to person place and time. Psychiatric: Normal mood and affect. Behavior is  normal.   ASSESSMENT AND PLAN: 54 year old female here for reassessment of the following issues:  Ulcerative pancolitis - mild colitis on colonoscopy last year, had recommended both oral and rectal mesalamine initially.  She was transitioned to balsalazide due to insurance issues, has not required rectal therapy and overall is tolerating the regimen well and for the most part has no symptoms that bothers her.  We discussed the regimen, including risks, she wants to continue it.  I do think that given her intermittent loose stool, colonoscopy is reasonable to follow-up now that she is on a reliable regimen  to ensure mucosal healing and to perform her screening as her prep was only fair during her last exam.  Discussed risk and benefits of colonoscopy and anesthesia with her and she wants to proceed, the question on her side is the timing of this in relation to her work schedule.  She wants to hold off on scheduling today but will call us back to schedule when she can find a date that works for her.  We will doing a prep and a half for her given prep on last exam.  She will continue balsalazide for now, call in the interim if any symptoms of flaring.  We will obtain baseline CBC and c-Met today to ensure stable on the regimen.  GERD - mild intermittent reflux symptoms, we discussed options.  She will try Pepcid as needed for now and see how that goes.  No alarm symptoms.  If this works she can continue it, if she continues to have symptoms that bother her despite this she can contact me to discuss other options such as PPI.  [place] Cellar, MD [facility]

## 2019-12-25 NOTE — Patient Instructions (Signed)
Please go to the lab in the basement of our building to have lab work done as you leave today. Hit "B" for basement when you get on the elevator.  When the doors open the lab is on your left.  We will call you with the results. Thank you.  Please call us when you are ready to schedule your colonoscopy:  [phone].  Due to recent changes in healthcare laws, you may see the results of your imaging and laboratory studies on MyChart before your provider has had a chance to review them.  We understand that in some cases there may be results that are confusing or concerning to you. Not all laboratory results come back in the same time frame and the provider may be waiting for multiple results in order to interpret others.  Please give [place] 48 hours in order for your provider to thoroughly review all the results before contacting the office for clarification of your results.   We have sent the following medications to your pharmacy for you to pick up at your convenience: Balsalazide  Use Pepcid (famotidine) over the counter as needed.  Thank you for entrusting me with your care and for choosing [facility], Dr. [place] Cellar

## 2020-01-23 ENCOUNTER — Telehealth: Payer: Self-pay

## 2020-01-23 NOTE — Telephone Encounter (Signed)
error 

## 2020-01-23 NOTE — Telephone Encounter (Signed)
-----   Message from [name], [place] sent at 12/25/2019  5:09 PM EDT ----- Regarding: did pt call back to schedule colonoscopy Pt was here for OV on 10-19 and did not schedule colon due to holidays. She has UC.   If she has not call to schedule, call her to see if you can help her get scheduled

## 2020-01-25 ENCOUNTER — Telehealth: Payer: Self-pay

## 2020-01-25 NOTE — Telephone Encounter (Signed)
-----   Message from [name], [place] sent at 12/25/2019  5:09 PM EDT ----- Regarding: did pt call back to schedule colonoscopy Pt was here for OV on 10-19 and did not schedule colon due to holidays. She has UC.   If she has not call to schedule, call her to see if you can help her get scheduled

## 2020-01-25 NOTE — Telephone Encounter (Signed)
Called and left detailed message for pt that Dr. Mickey Farber January  schedule is open if she would like to get her colonoscopy scheduled. Asked her to call soon so she has her pick of dates and times.

## 2020-04-23 ENCOUNTER — Other Ambulatory Visit: Payer: Self-pay | Admitting: Gastroenterology

## 2020-08-26 ENCOUNTER — Telehealth: Payer: Self-pay | Admitting: Gastroenterology

## 2020-08-26 NOTE — Telephone Encounter (Addendum)
Inbound call from patient requesting to schedule colonoscopy.  Ok to schedule direct or office visit first?  Please advise.

## 2020-08-26 NOTE — Telephone Encounter (Signed)
Called patient and left voice mail to call back to schedule colonoscopy.

## 2020-08-27 ENCOUNTER — Encounter: Payer: Self-pay | Admitting: Gastroenterology

## 2020-09-22 ENCOUNTER — Telehealth: Payer: Self-pay | Admitting: Gastroenterology

## 2020-09-22 MED ORDER — MESALAMINE 4 G RE ENEM: 4.0000 g | ENEMA | Freq: Every evening | RECTAL | 0 refills | Status: DC | PRN

## 2020-09-22 NOTE — Telephone Encounter (Signed)
Inbound call from patient requesting refills for enema.  Please advise.

## 2020-09-22 NOTE — Telephone Encounter (Signed)
Script sent to pharmacy. Patient informed

## 2020-09-22 NOTE — Telephone Encounter (Signed)
Patient with ulcerative pancolitis scheduled for colonoscopy in September. Last seen in the office 12-2019. She has been using Rowasa enemas as needed. Last script sent in July of 2020 (#30 w/ 2R). Ok to send script of #30 to be used qhs prn? Please advise.

## 2020-09-22 NOTE — Telephone Encounter (Signed)
Yes okay to refill. I will see her in Sept for colonoscopy. Thanks

## 2020-10-03 ENCOUNTER — Other Ambulatory Visit: Payer: Self-pay | Admitting: Gastroenterology

## 2020-10-29 ENCOUNTER — Other Ambulatory Visit: Payer: Self-pay

## 2020-10-29 ENCOUNTER — Ambulatory Visit (AMBULATORY_SURGERY_CENTER): Payer: Self-pay | Admitting: *Deleted

## 2020-10-29 VITALS — Ht 65.0 in | Wt 200.4 lb

## 2020-10-29 DIAGNOSIS — K5100 Ulcerative (chronic) pancolitis without complications: Secondary | ICD-10-CM

## 2020-10-29 MED ORDER — SUTAB 1479-225-188 MG PO TABS: 1.0000 | ORAL_TABLET | Freq: Once | ORAL | 0 refills | Status: AC

## 2020-10-29 NOTE — Progress Notes (Signed)
  No trouble with anesthesia, denies being told they were difficult to intubate, or hx/fam hx of malignant hyperthermia per pt    No egg or soy allergy  No home oxygen use   No medications for weight loss taken  Pt denies constipation issues  Pt informed that we do not do prior authorizations for prep  Sutab coupon given and put into RX  Pt had fair prep last procedure- 2 day Sutab/Miralax prep given; pt requested "the pills"

## 2020-11-12 ENCOUNTER — Ambulatory Visit (AMBULATORY_SURGERY_CENTER): Payer: BC Managed Care – PPO | Admitting: Gastroenterology

## 2020-11-12 ENCOUNTER — Other Ambulatory Visit: Payer: Self-pay

## 2020-11-12 ENCOUNTER — Encounter: Payer: Self-pay | Admitting: Gastroenterology

## 2020-11-12 VITALS — BP 111/72 | HR 106 | Temp 98.7°F | Resp 16 | Ht 65.0 in | Wt 200.4 lb

## 2020-11-12 DIAGNOSIS — K529 Noninfective gastroenteritis and colitis, unspecified: Secondary | ICD-10-CM | POA: Diagnosis not present

## 2020-11-12 DIAGNOSIS — K5150 Left sided colitis without complications: Secondary | ICD-10-CM | POA: Diagnosis not present

## 2020-11-12 DIAGNOSIS — K5100 Ulcerative (chronic) pancolitis without complications: Secondary | ICD-10-CM | POA: Insufficient documentation

## 2020-11-12 MED ORDER — PREDNISONE 5 MG PO TABS: ORAL_TABLET | ORAL | 0 refills | Status: DC

## 2020-11-12 MED ORDER — SODIUM CHLORIDE 0.9 % IV SOLN: 500.0000 mL | Freq: Once | INTRAVENOUS | Status: DC

## 2020-11-12 NOTE — Progress Notes (Signed)
To pacu, VSS. Report to [email or web address] 

## 2020-11-12 NOTE — Patient Instructions (Addendum)
Please, stop your balsalazide per Dr. Havery Moros.  Please, start your prednisone taper as ordered.  It is at your pharmacy.   YOU HAD AN ENDOSCOPIC PROCEDURE TODAY AT [facility]:   Refer to the procedure report that was given to you for any specific questions about what was found during the examination.  If the procedure report does not answer your questions, please call your gastroenterologist to clarify.  If you requested that your care partner not be given the details of your procedure findings, then the procedure report has been included in a sealed envelope for you to review at your convenience later.  YOU SHOULD EXPECT: Some feelings of bloating in the abdomen. Passage of more gas than usual.  Walking can help get rid of the air that was put into your GI tract during the procedure and reduce the bloating. If you had a lower endoscopy (such as a colonoscopy or flexible sigmoidoscopy) you may notice spotting of blood in your stool or on the toilet paper. If you underwent a bowel prep for your procedure, you may not have a normal bowel movement for a few days.  Please Note:  You might notice some irritation and congestion in your nose or some drainage.  This is from the oxygen used during your procedure.  There is no need for concern and it should clear up in a day or so.  SYMPTOMS TO REPORT IMMEDIATELY:  Following lower endoscopy (colonoscopy or flexible sigmoidoscopy):  Excessive amounts of blood in the stool  Significant tenderness or worsening of abdominal pains  Swelling of the abdomen that is new, acute  Fever of 100F or higher     For urgent or emergent issues, a gastroenterologist can be reached at any hour by calling [phone]. Do not use MyChart messaging for urgent concerns.    DIET:  We do recommend a small meal at first, but then you may proceed to your regular diet.  Drink plenty of fluids but you should avoid alcoholic beverages for 24  hours.  ACTIVITY:  You should plan to take it easy for the rest of today and you should NOT DRIVE or use heavy machinery until tomorrow (because of the sedation medicines used during the test).    FOLLOW UP: Our staff will call the number listed on your records 48-72 hours following your procedure to check on you and address any questions or concerns that you may have regarding the information given to you following your procedure. If we do not reach you, we will leave a message.  We will attempt to reach you two times.  During this call, we will ask if you have developed any symptoms of COVID 19. If you develop any symptoms (ie: fever, flu-like symptoms, shortness of breath, cough etc.) before then, please call [phone].  If you test positive for Covid 19 in the 2 weeks post procedure, please call and report this information to [place].    If any biopsies were taken you will be contacted by phone or by letter within the next 1-3 weeks.  Please call us at [phone] if you have not heard about the biopsies in 3 weeks.    SIGNATURES/CONFIDENTIALITY: You and/or your care partner have signed paperwork which will be entered into your electronic medical record.  These signatures attest to the fact that that the information above on your After Visit Summary has been reviewed and is understood.  Full responsibility of the confidentiality of this discharge information  lies with you and/or your care-partner.

## 2020-11-12 NOTE — Progress Notes (Signed)
Called to room to assist during endoscopic procedure.  Patient ID and intended procedure confirmed with present staff. Received instructions for my participation in the procedure from the performing physician.  

## 2020-11-12 NOTE — Progress Notes (Signed)
Pt's states no medical or surgical changes since previsit or office visit. Vitals CW,  IV SM

## 2020-11-12 NOTE — Progress Notes (Signed)
[facility] History and Physical   Primary Care Physician:  Forrest Moron, MD   Reason for Procedure:   Ulcerative colitis   Plan:    colonoscopy     HPI: Samantha Barry is a 55 y.o. female  here for colonoscopy for ulcerative colitis.On balsalazide and Rowasa PRN. Had been doing well when I saw her in October but symptoms seem to be more frequently bothering her lately..Otherwise feels well without any chest pains, shortness of breath. She has a mild sinus tachycardia on monitor in procedure room, 100-110. She states her watch tells her she has mild tachycardia at times but no palpitations, lightheadedness / dizzines.  Past Medical History:  Diagnosis Date   ADHD    Depression    GERD (gastroesophageal reflux disease)    Osteoporosis    Ulcerative colitis ([place])    Urinary incontinence     Past Surgical History:  Procedure Laterality Date   APPENDECTOMY     CHOLECYSTECTOMY     COLONOSCOPY     LAPAROSCOPIC APPENDECTOMY N/A 04/25/2017   Procedure: APPENDECTOMY LAPAROSCOPIC;  Surgeon: Greer Pickerel, MD;  Location: [facility];  Service: General;  Laterality: N/A;   MOUTH SURGERY      Prior to Admission medications   Medication Sig Start Date End Date Taking? Authorizing Provider  amphetamine-dextroamphetamine (ADDERALL) 20 MG tablet Take 20 mg by mouth daily.   Yes [provider]  balsalazide (COLAZAL) 750 MG capsule TAKE 2 CAPSULES BY MOUTH IN THE MORNING AND AT BEDTIME. CALL SCHEDULE COLONOSCOPY: [phone] 10/03/20  Yes Rivaan Kendall, Carlota Raspberry, MD  escitalopram (LEXAPRO) 20 MG tablet Take 20 mg by mouth daily. 04/07/17  Yes [provider]  lisdexamfetamine (VYVANSE) 60 MG capsule Take 60 mg by mouth every morning.   Yes [provider]  mesalamine (ROWASA) 4 g enema Place 60 mLs (4 g total) rectally at bedtime as needed. 09/22/20  Yes Kennie Karapetian, Carlota Raspberry, MD  REXULTI 1 MG TABS Take 2 mg by mouth at bedtime.  03/27/16  Yes [provider]  acetaminophen (TYLENOL) 325 MG tablet You can take 2 tablets every 4-6 hours as needed for pain.  You can alternate with plain Tylenol or Ibuprofen, or the prescribed pain medicine.    DO NOT TAKE MORE THAN 4000 MG OF TYLENOL PER DAY.  IT CAN HARM YOUR LIVER.  TYLENOL (ACETAMINOPHEN) IS ALSO IN YOUR PRESCRIPTION PAIN MEDICATION.  YOU HAVE TO COUNT IT IN YOUR DAILY TOTAL. Patient not taking: Reported on 11/12/2020 04/26/17   Earnstine Regal, PA-C  ALPRAZolam Duanne Moron) 0.25 MG tablet Take 0.25 mg by mouth at bedtime as needed for anxiety.    [provider]  famotidine (PEPCID) 20 MG tablet Take 1 tablet (20 mg total) by mouth as needed for heartburn or indigestion. Patient not taking: Reported on 11/12/2020 12/25/19   Yetta Flock, MD  fluticasone [facility] Dt) 50 MCG/ACT nasal spray Place 2 sprays into both nostrils daily. Patient taking differently: Place 2 sprays into both nostrils daily as needed. 06/30/17   Forrest Moron, MD    Current Outpatient Medications  Medication Sig Dispense Refill   amphetamine-dextroamphetamine (ADDERALL) 20 MG tablet Take 20 mg by mouth daily.     balsalazide (COLAZAL) 750 MG capsule TAKE 2 CAPSULES BY MOUTH IN THE MORNING AND AT BEDTIME. CALL SCHEDULE COLONOSCOPY: [phone] 360 capsule 0   escitalopram (LEXAPRO) 20 MG tablet Take 20 mg by mouth daily.  5   lisdexamfetamine (VYVANSE) 60 MG capsule Take 60  mg by mouth every morning.     mesalamine (ROWASA) 4 g enema Place 60 mLs (4 g total) rectally at bedtime as needed. 30 mL 0   REXULTI 1 MG TABS Take 2 mg by mouth at bedtime.      acetaminophen (TYLENOL) 325 MG tablet You can take 2 tablets every 4-6 hours as needed for pain.  You can alternate with plain Tylenol or Ibuprofen, or the prescribed pain medicine.    DO NOT TAKE MORE THAN 4000 MG OF TYLENOL PER DAY.  IT CAN HARM YOUR LIVER.  TYLENOL (ACETAMINOPHEN) IS ALSO IN YOUR PRESCRIPTION PAIN MEDICATION.  YOU HAVE TO COUNT IT IN  YOUR DAILY TOTAL. (Patient not taking: Reported on 11/12/2020)     ALPRAZolam (XANAX) 0.25 MG tablet Take 0.25 mg by mouth at bedtime as needed for anxiety.     famotidine (PEPCID) 20 MG tablet Take 1 tablet (20 mg total) by mouth as needed for heartburn or indigestion. (Patient not taking: Reported on 11/12/2020)     fluticasone (FLONASE) 50 MCG/ACT nasal spray Place 2 sprays into both nostrils daily. (Patient taking differently: Place 2 sprays into both nostrils daily as needed.) 48 g 3   Current Facility-Administered Medications  Medication Dose Route Frequency Provider Last Rate Last Admin   0.9 %  sodium chloride infusion  500 mL Intravenous Once Teigen Parslow, Carlota Raspberry, MD        Allergies as of 11/12/2020   (No Known Allergies)    Family History  Problem Relation Age of Onset   Scoliosis Mother    Osteoporosis Mother    Hypertension Father    Hypertension Maternal Grandmother    Hodgkin's lymphoma Maternal Grandmother    Colon cancer Neg Hx    Stomach cancer Neg Hx    Esophageal cancer Neg Hx    Liver disease Neg Hx    Pancreatic cancer Neg Hx     Social History   Socioeconomic History   Marital status: Single    Spouse name: Not on file   Number of children: Not on file   Years of education: Not on file   Highest education level: Not on file  Occupational History   Not on file  Tobacco Use   Smoking status: Never   Smokeless tobacco: Never  Vaping Use   Vaping Use: Never used  Substance and Sexual Activity   Alcohol use: Yes    Comment: occ   Drug use: No   Sexual activity: Never    Partners: Female    Birth control/protection: None  Other Topics Concern   Not on file  Social History Narrative   Teacher - Teacher, early years/pre   Social Determinants of Health   Financial Resource Strain: Not on file  Food Insecurity: Not on file  Transportation Needs: Not on file  Physical Activity: Not on file  Stress: Not on file  Social Connections: Not on file   Intimate Partner Violence: Not on file    Review of Systems: All other review of systems negative except as mentioned in the HPI.  Physical Exam: Vital signs BP 120/89   Pulse (!) 117   Temp 98.7 F (37.1 C)   Resp 18   Ht 5' 5"  (1.651 m)   Wt 200 lb 6.4 oz (90.9 kg)   LMP  (LMP Unknown)   SpO2 95%   BMI 33.35 kg/m   General:   Alert,  Well-developed, well-nourished, pleasant and cooperative in NAD Lungs:  Clear throughout to  auscultation.   Heart:  Regular rhythm, mild sinus tachycardia Abdomen:  Soft, nontender and nondistended.   Neuro/Psych:  Alert and cooperative. Normal mood and affect. A and O x 3  Jolly Mango, MD [facility]

## 2020-11-12 NOTE — Op Note (Signed)
[facility] Patient Name: Samantha Barry Procedure Date: 11/12/2020 3:31 PM MRN: [id number] Endoscopist: Remo Lipps P. Havery Moros , MD Age: 55 Referring MD:  Date of Birth: [birth year] Gender: Female Account #: [id number] Procedure:                Colonoscopy Indications:              Assess therapeutic response to therapy of                            ulcerative pancolitis - dx 09/2018 - on balsalazide                            and Rowasa PRN - had been doing well at time of                            last clinic visit. Has had interval worsening of                            symptoms despite compliance with balsalazide /                            Rowasa Medicines:                Monitored Anesthesia Care Procedure:                Pre-Anesthesia Assessment:                           - Prior to the procedure, a History and Physical                            was performed, and patient medications and                            allergies were reviewed. The patient's tolerance of                            previous anesthesia was also reviewed. The risks                            and benefits of the procedure and the sedation                            options and risks were discussed with the patient.                            All questions were answered, and informed consent                            was obtained. Prior Anticoagulants: The patient has                            taken no previous anticoagulant or antiplatelet  agents. ASA Grade Assessment: II - A patient with                            mild systemic disease. After reviewing the risks                            and benefits, the patient was deemed in                            satisfactory condition to undergo the procedure.                           After obtaining informed consent, the colonoscope                            was passed under direct vision. Throughout the                             procedure, the patient's blood pressure, pulse, and                            oxygen saturations were monitored continuously. The                            PCF-HQ190L Colonoscope was introduced through the                            anus and advanced to the the terminal ileum, with                            identification of the appendiceal orifice and IC                            valve. The colonoscopy was performed without                            difficulty. The patient tolerated the procedure                            well. The quality of the bowel preparation was                            good. The terminal ileum, ileocecal valve,                            appendiceal orifice, and rectum were photographed. Scope In: 3:42:00 PM Scope Out: 3:57:25 PM Scope Withdrawal Time: 0 hours 9 minutes 35 seconds  Total Procedure Duration: 0 hours 15 minutes 25 seconds  Findings:                 The perianal and digital rectal examinations were                            normal.  The terminal ileum appeared normal.                           Inflammation was found in a continuous and                            circumferential pattern from the rectum to the                            splenic flexure. This was graded as Mayo Score 2                            (moderate, with marked erythema, absent vascular                            pattern, friability, erosions).                           There was some mild erythema in the tranverse and                            right colon, the exam was otherwise normal                            throughout the examined colon.                           Biopsies were taken with a cold forceps from the                            right colon, left colon and transverse colon for                            ulcerative colitis surveillance. These biopsy                            specimens were sent to  Pathology. Complications:            No immediate complications. Estimated blood loss:                            Minimal. Estimated Blood Loss:     Estimated blood loss was minimal. Impression:               - The examined portion of the ileum was normal.                           - Moderately active (Mayo Score 2) ulcerative                            colitis of the left colon.                           - Biopsies for surveillance were taken from the  right colon, left colon and transverse colon.                           Unfortunately colitis is poorly controlled despite                            oral balsalazide and Rowasa. Recommend escalation                            of therapy to biologic, will discuss with the                            patient Recommendation:           - Patient has a contact number available for                            emergencies. The signs and symptoms of potential                            delayed complications were discussed with the                            patient. Return to normal activities tomorrow.                            Written discharge instructions were provided to the                            patient.                           - Resume previous diet.                           - Continue present medications.                           - Start prednisone 34m / day for 2 weeks, then                            taper by 56m/ week until off                           - Can stop balsalazide / Rowasa given findings, if                            proceeding with prednisone taper                           - Transition to biologic therapy, I will discuss                            options with the patient, will [facility]  [facility] due to favorable side effect profile, if                            covered by insurance                           - Await pathology results. Remo Lipps P.  Safa Derner, MD 11/12/2020 4:07:10 PM This report has been signed electronically.

## 2020-11-13 ENCOUNTER — Telehealth: Payer: Self-pay | Admitting: Pharmacy Technician

## 2020-11-13 DIAGNOSIS — K5100 Ulcerative (chronic) pancolitis without complications: Secondary | ICD-10-CM

## 2020-11-13 NOTE — Telephone Encounter (Signed)
Dr. Grayce Sessions Submission: Approved Payer: [employer] Medication & CPT/J Code(s) submitted: Entyvio (Vedolizumab) [phone] Route of submission (phone, fax, portal): FAX [phone]  Auth type: Buy/Bill Units/visits requested: 2400 UNITS Reference number: [id number] Approval Date: 11/13/20 - 11/12/21   Will enroll patient in [employer]. Will update with response.

## 2020-11-13 NOTE — Telephone Encounter (Signed)
Wonderful thanks.   Jan can you order a quantiferon gold and ask this patient to go to the lab to have this drawn? Thanks

## 2020-11-13 NOTE — Telephone Encounter (Signed)
Auth Submission: PENDING  Payer: [employer] Medication & CPT/J Code(s) submitted: Entyvio (Vedolizumab) [id number] Route of submission (phone, fax, portal): FAX [phone] PHONE [phone] Auth type: Buy/Bill Units/visits requested: 8   Will update once we receive a response.

## 2020-11-14 ENCOUNTER — Telehealth: Payer: Self-pay

## 2020-11-14 ENCOUNTER — Telehealth: Payer: Self-pay | Admitting: Gastroenterology

## 2020-11-14 NOTE — Telephone Encounter (Signed)
NO ANSWER, MESSAGE LEFT FOR PATIENT.

## 2020-11-14 NOTE — Telephone Encounter (Signed)
  Follow up Call-  Call back number 11/12/2020 10/02/2018  Post procedure Call Back phone  # [phone]  Permission to leave phone message Yes Yes  Some recent data might be hidden     Patient questions:  Do you have a fever, pain , or abdominal swelling? No. Pain Score  0 *  Have you tolerated food without any problems? Yes.    Have you been able to return to your normal activities? Yes.    Do you have any questions about your discharge instructions: Diet   No. Medications  Yes.  Pt. Reported she did not receive enough Prednisone in her prescription to follow Dr. Doyne Keel instructions.  Pt. Only received 30 5 mg. Tablets.  She called this morning, and has not received a follow-up call.  Apologized to pt. That she was having to pursue this, and that we would be sure her drug store received appropriate dispensing authorization before the end of the day. Follow up visit  No.  Do you have questions or concerns about your Care? Yes.  (See above note).   Will be certain this is taken care of, and pt. Advised when pharmacy has been notified.  Actions: * If pain score is 4 or above: No action needed, pain

## 2020-11-14 NOTE — Telephone Encounter (Signed)
Called [facility] to authorize the dispensing of the full amount of Prednisone as prescribed to pt. Following her endoscopy procedure on 11/12/2020.  Spoke to [place] at [facility].  Will call pt. And tell her the full amount of Prednisone will be ready for pick-up.  40 mg./day for 2 weeks, then taper by 5 mg/week until off, less the 30  5 mg. Tablets that the pt. Has already received.

## 2020-11-14 NOTE — Telephone Encounter (Signed)
[facility] and duration of [facility] for [facility]one. [facility]s [facility] at [phone].

## 2020-11-14 NOTE — Telephone Encounter (Signed)
Order entered.  MyChart message sent to the patient asking her to go to the lab

## 2020-11-14 NOTE — Telephone Encounter (Signed)
Communicated with pt. Her options for obtaining sufficient Prednisone to cover her dose for the weekend.  Pt. Chose to go tomorrow and pick up enough Prednisone for the weekend with a discount card (for $4.28), then return the the pharmacy Monday to obtain the remainder of the prescription to be covered by her insurance.

## 2020-11-14 NOTE — Telephone Encounter (Signed)
Called pt. To tell her that her pharmacy should have her prescription ready for pick-up.  Pt. Reports she received a text from the pharmacy 30 minutes ago saying that they were unable to dispense the Prednisone until 11/17/2020.  Told pt. I would call the pharmacy now to let them she needs more medication by tomorrow.

## 2020-11-14 NOTE — Addendum Note (Signed)
Addended by: [name] on: 11/14/2020 07:48 AM   Modules accepted: Orders

## 2020-11-14 NOTE — Telephone Encounter (Signed)
1651 p.m. spoke to Ashwani at the [facility] re: pt.'s reception of a text that said she could not get her Prednisone dispensed until 11/17/2020.  Pharmacy staff [place] said pt. Could come in and pay out of pocket using a discount card for the tablets needed to cover her weekend dose, or they could call the ins. Co. And petition an override to obtain the additional tablets.  Relayed the information to the pt., and called Ashwani back at 1648 to let her know the pt. Would come in tomorrow morning and pay with a discount card, then come in again on Monday to receive the remainder of the tablets, to be covered by insurance.

## 2020-11-17 ENCOUNTER — Other Ambulatory Visit: Payer: BC Managed Care – PPO

## 2020-11-17 DIAGNOSIS — K5100 Ulcerative (chronic) pancolitis without complications: Secondary | ICD-10-CM

## 2020-11-17 NOTE — Addendum Note (Signed)
Addended by: Octavio Manns E on: 11/17/2020 05:20 PM   Modules accepted: Orders

## 2020-11-20 ENCOUNTER — Telehealth: Payer: Self-pay | Admitting: Pharmacy Technician

## 2020-11-20 LAB — QUANTIFERON-TB GOLD PLUS
Mitogen-NIL: 8.02 IU/mL
NIL: 0.04 IU/mL
QuantiFERON-TB Gold Plus: NEGATIVE
TB1-NIL: 0 IU/mL
TB2-NIL: <0 IU/mL

## 2020-11-20 NOTE — Telephone Encounter (Addendum)
(  Fyi note)   ENTYVIO CO-PAY PENDING  Forms completed and faxed to [facility] [phone]  Will update with response

## 2020-11-24 ENCOUNTER — Ambulatory Visit (INDEPENDENT_AMBULATORY_CARE_PROVIDER_SITE_OTHER): Payer: BC Managed Care – PPO

## 2020-11-24 ENCOUNTER — Other Ambulatory Visit: Payer: Self-pay

## 2020-11-24 ENCOUNTER — Telehealth: Payer: Self-pay | Admitting: Gastroenterology

## 2020-11-24 VITALS — BP 108/73 | HR 88 | Temp 98.6°F | Resp 16 | Ht 66.0 in | Wt 197.0 lb

## 2020-11-24 DIAGNOSIS — K5100 Ulcerative (chronic) pancolitis without complications: Secondary | ICD-10-CM | POA: Diagnosis not present

## 2020-11-24 MED ORDER — DIPHENHYDRAMINE HCL 50 MG/ML IJ SOLN: 50.0000 mg | Freq: Once | INTRAMUSCULAR | Status: DC | PRN

## 2020-11-24 MED ORDER — FAMOTIDINE IN NACL 20-0.9 MG/50ML-% IV SOLN: 20.0000 mg | Freq: Once | INTRAVENOUS | Status: DC | PRN

## 2020-11-24 MED ORDER — VEDOLIZUMAB 300 MG IV SOLR
300.0000 mg | Freq: Once | INTRAVENOUS | Status: AC
  Administered 2020-11-24: 300 mg via INTRAVENOUS
  Filled 2020-11-24: qty 5

## 2020-11-24 MED ORDER — METHYLPREDNISOLONE SODIUM SUCC 125 MG IJ SOLR: 125.0000 mg | Freq: Once | INTRAMUSCULAR | Status: DC | PRN

## 2020-11-24 MED ORDER — EPINEPHRINE 0.3 MG/0.3ML IJ SOAJ: 0.3000 mg | Freq: Once | INTRAMUSCULAR | Status: DC | PRN

## 2020-11-24 MED ORDER — ALBUTEROL SULFATE HFA 108 (90 BASE) MCG/ACT IN AERS: 2.0000 | INHALATION_SPRAY | Freq: Once | RESPIRATORY_TRACT | Status: DC | PRN

## 2020-11-24 MED ORDER — SODIUM CHLORIDE 0.9 % IV SOLN: Freq: Once | INTRAVENOUS | Status: DC | PRN

## 2020-11-24 NOTE — Progress Notes (Signed)
Diagnosis: Chronic Ulcerative enterocolitis  Provider:  Marshell Garfinkel, MD  Procedure: Infusion  IV Type: Peripheral, IV Location: L Antecubital  Entyvio (Vedolizumab), Dose: 300 mg  Infusion Start Time: 14.36 11/24/2020 Infusion Stop Time: 15.23 11/24/2020  Post Infusion IV Care: 30 minutes Observation period completed and Peripheral IV Discontinued  Discharge: Condition: Good, Destination: Home . AVS provided to patient.   Performed by:  Arnoldo Morale, RN

## 2020-11-25 NOTE — Telephone Encounter (Signed)
Entyvio assistance forms were completed and faxed back to [facility] yesterday. I spoke with Maudie Mercury this morning and she has received the completed forms.

## 2020-12-01 NOTE — Telephone Encounter (Addendum)
FYI NOTE: ENTYVIO  APPROVED: CO-PAY CARD  BIN: [id number] PCN: [id number] GRP: [id number] ID# [id number] LIMIT: $20,000 DATE: 11/27/20 - 11/27/25

## 2020-12-08 ENCOUNTER — Ambulatory Visit (INDEPENDENT_AMBULATORY_CARE_PROVIDER_SITE_OTHER): Payer: BC Managed Care – PPO

## 2020-12-08 ENCOUNTER — Other Ambulatory Visit: Payer: Self-pay

## 2020-12-08 VITALS — BP 124/80 | HR 92 | Temp 98.3°F | Resp 18

## 2020-12-08 DIAGNOSIS — K5100 Ulcerative (chronic) pancolitis without complications: Secondary | ICD-10-CM

## 2020-12-08 MED ORDER — DIPHENHYDRAMINE HCL 50 MG/ML IJ SOLN: 50.0000 mg | Freq: Once | INTRAMUSCULAR | Status: DC | PRN

## 2020-12-08 MED ORDER — ALBUTEROL SULFATE HFA 108 (90 BASE) MCG/ACT IN AERS: 2.0000 | INHALATION_SPRAY | Freq: Once | RESPIRATORY_TRACT | Status: DC | PRN

## 2020-12-08 MED ORDER — FAMOTIDINE IN NACL 20-0.9 MG/50ML-% IV SOLN: 20.0000 mg | Freq: Once | INTRAVENOUS | Status: DC | PRN

## 2020-12-08 MED ORDER — METHYLPREDNISOLONE SODIUM SUCC 125 MG IJ SOLR: 125.0000 mg | Freq: Once | INTRAMUSCULAR | Status: DC | PRN

## 2020-12-08 MED ORDER — EPINEPHRINE 0.3 MG/0.3ML IJ SOAJ: 0.3000 mg | Freq: Once | INTRAMUSCULAR | Status: DC | PRN

## 2020-12-08 MED ORDER — VEDOLIZUMAB 300 MG IV SOLR
300.0000 mg | Freq: Once | INTRAVENOUS | Status: AC
  Administered 2020-12-08: 300 mg via INTRAVENOUS
  Filled 2020-12-08: qty 5

## 2020-12-08 MED ORDER — SODIUM CHLORIDE 0.9 % IV SOLN: Freq: Once | INTRAVENOUS | Status: DC | PRN

## 2020-12-08 NOTE — Progress Notes (Signed)
Diagnosis: Crohn's Disease  Provider:  Marshell Garfinkel, MD  Procedure: Infusion  IV Type: Peripheral, IV Location: L Antecubital  Entyvio (Vedolizumab), Dose: 300 mg  Infusion Start Time: 9758  Infusion Stop Time: 8325  Post Infusion IV Care: Peripheral IV Discontinued  Discharge: Condition: Good, Destination: Home . AVS provided to patient.   Performed by:  Arnoldo Morale, RN

## 2021-01-01 ENCOUNTER — Other Ambulatory Visit: Payer: Self-pay | Admitting: Gastroenterology

## 2021-01-05 ENCOUNTER — Ambulatory Visit (INDEPENDENT_AMBULATORY_CARE_PROVIDER_SITE_OTHER): Payer: BC Managed Care – PPO

## 2021-01-05 ENCOUNTER — Other Ambulatory Visit: Payer: Self-pay

## 2021-01-05 VITALS — BP 119/78 | HR 89 | Temp 98.4°F | Resp 18 | Ht 65.0 in | Wt 204.2 lb

## 2021-01-05 DIAGNOSIS — K5100 Ulcerative (chronic) pancolitis without complications: Secondary | ICD-10-CM | POA: Diagnosis not present

## 2021-01-05 MED ORDER — FAMOTIDINE IN NACL 20-0.9 MG/50ML-% IV SOLN: 20.0000 mg | Freq: Once | INTRAVENOUS | Status: DC | PRN

## 2021-01-05 MED ORDER — SODIUM CHLORIDE 0.9 % IV SOLN: Freq: Once | INTRAVENOUS | Status: DC | PRN

## 2021-01-05 MED ORDER — DIPHENHYDRAMINE HCL 50 MG/ML IJ SOLN: 50.0000 mg | Freq: Once | INTRAMUSCULAR | Status: DC | PRN

## 2021-01-05 MED ORDER — ALBUTEROL SULFATE HFA 108 (90 BASE) MCG/ACT IN AERS: 2.0000 | INHALATION_SPRAY | Freq: Once | RESPIRATORY_TRACT | Status: DC | PRN

## 2021-01-05 MED ORDER — METHYLPREDNISOLONE SODIUM SUCC 125 MG IJ SOLR: 125.0000 mg | Freq: Once | INTRAMUSCULAR | Status: DC | PRN

## 2021-01-05 MED ORDER — VEDOLIZUMAB 300 MG IV SOLR
300.0000 mg | Freq: Once | INTRAVENOUS | Status: AC
  Administered 2021-01-05: 300 mg via INTRAVENOUS
  Filled 2021-01-05: qty 5

## 2021-01-05 MED ORDER — EPINEPHRINE 0.3 MG/0.3ML IJ SOAJ: 0.3000 mg | Freq: Once | INTRAMUSCULAR | Status: DC | PRN

## 2021-01-05 NOTE — Progress Notes (Signed)
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 AVS provided to patient.   Performed by:  Charlie Pitter, RN

## 2021-01-06 ENCOUNTER — Encounter: Payer: Self-pay | Admitting: Gastroenterology

## 2021-01-12 NOTE — Progress Notes (Signed)
55 y.o. G0P0000 Single White or Caucasian Not Hispanic or Latino female here for annual exam.  PMP, no bleeding. No vasomotor symptoms. Lives with her long term partner.   Incontinence is better. Has ulcerative colitis, under control with medication.     No LMP recorded (lmp unknown). Patient is postmenopausal.          Sexually active: No. female partner The current method of family planning is post menopausal status.    Exercising: No.  The patient does not participate in regular exercise at present. Smoker:  no  Health Maintenance: Pap:  03/10/2016-WNL, HPV- neg  History of abnormal Pap:  no MMG:  08/15/20- Birads 2 BMD:   n/a Colonoscopy: 11/12/20- f/u 1 year  TDaP:  03/10/2016 Gardasil: n/a   reports that she has never smoked. She has never used smokeless tobacco. She reports current alcohol use. She reports that she does not use drugs. 1-3 drinks a week. 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 Has 3 dogs.   Past Medical History:  Diagnosis Date   ADHD    Depression    GERD (gastroesophageal reflux disease)    Osteoporosis    Ulcerative colitis ([place])    Urinary incontinence     Past Surgical History:  Procedure Laterality Date   APPENDECTOMY     CHOLECYSTECTOMY     COLONOSCOPY     LAPAROSCOPIC APPENDECTOMY N/A 04/25/2017   Procedure: APPENDECTOMY LAPAROSCOPIC;  Surgeon: Greer Pickerel, MD;  Location: [facility];  Service: General;  Laterality: N/A;   MOUTH SURGERY      Current Outpatient Medications  Medication Sig Dispense Refill   acetaminophen (TYLENOL) 325 MG tablet You can take 2 tablets every 4-6 hours as needed for pain.  You can alternate with plain Tylenol or Ibuprofen, or the prescribed pain medicine.    DO NOT TAKE MORE THAN 4000 MG OF TYLENOL PER DAY.  IT CAN HARM YOUR LIVER.  TYLENOL (ACETAMINOPHEN) IS ALSO IN YOUR PRESCRIPTION PAIN MEDICATION.  YOU HAVE TO COUNT IT IN YOUR DAILY TOTAL. (Patient taking differently: You can take 2 tablets every 4-6 hours  as needed for pain.  You can alternate with plain Tylenol or Ibuprofen, or the prescribed pain medicine.    DO NOT TAKE MORE THAN 4000 MG OF TYLENOL PER DAY.  IT CAN HARM YOUR LIVER.  TYLENOL (ACETAMINOPHEN) IS ALSO IN YOUR PRESCRIPTION PAIN MEDICATION.  YOU HAVE TO COUNT IT IN YOUR DAILY TOTAL.)     ALPRAZolam (XANAX) 0.25 MG tablet Take 0.25 mg by mouth at bedtime as needed for anxiety.     amphetamine-dextroamphetamine (ADDERALL) 20 MG tablet Take 20 mg by mouth daily.     escitalopram (LEXAPRO) 20 MG tablet Take 20 mg by mouth daily.  5   famotidine (PEPCID) 20 MG tablet Take 1 tablet (20 mg total) by mouth as needed for heartburn or indigestion.     fluticasone (FLONASE) 50 MCG/ACT nasal spray Place 2 sprays into both nostrils daily. (Patient taking differently: Place 2 sprays into both nostrils daily as needed.) 48 g 3   lisdexamfetamine (VYVANSE) 60 MG capsule Take 60 mg by mouth every morning.     mesalamine (ROWASA) 4 g enema Place 60 mLs (4 g total) rectally at bedtime as needed. 30 mL 0   metoprolol succinate (TOPROL-XL) 25 MG 24 hr tablet Take 12.5 mg by mouth daily.     REXULTI 1 MG TABS Take 2 mg by mouth at bedtime.  No current facility-administered medications for this visit.    Family History  Problem Relation Age of Onset   Scoliosis Mother    Osteoporosis Mother    Hypertension Father    Hypertension Maternal Grandmother    Hodgkin's lymphoma Maternal Grandmother    Colon cancer Neg Hx    Stomach cancer Neg Hx    Esophageal cancer Neg Hx    Liver disease Neg Hx    Pancreatic cancer Neg Hx   No h/o hip fracture  Review of Systems  All other systems reviewed and are negative.  Exam:   BP 122/80   Pulse (!) 102   Ht 5' 4.17" (1.63 m)   Wt 202 lb (91.6 kg)   LMP  (LMP Unknown)   SpO2 100%   BMI 34.49 kg/m   Weight change: @WEIGHTCHANGE @ Height:   Height: 5' 4.17" (163 cm)  Ht Readings from Last 3 Encounters:  01/13/21 5' 4.17" (1.63 m)  01/05/21 5' 5"   (1.651 m)  11/24/20 5' 6"  (1.676 m)    General appearance: alert, cooperative and appears stated age Head: Normocephalic, without obvious abnormality, atraumatic Neck: no adenopathy, supple, symmetrical, trachea midline and thyroid normal to inspection and palpation Lungs: clear to auscultation bilaterally Cardiovascular: regular rate and rhythm Breasts: normal appearance, no masses or tenderness Abdomen: soft, non-tender; non distended,  no masses,  no organomegaly Extremities: extremities normal, atraumatic, no cyanosis or edema Skin: Skin color, texture, turgor normal. No rashes or lesions Lymph nodes: Cervical, supraclavicular, and axillary nodes normal. No abnormal inguinal nodes palpated Neurologic: Grossly normal   Pelvic: External genitalia:  no lesions              Urethra:  normal appearing urethra with no masses, tenderness or lesions              Bartholins and Skenes: normal                 Vagina: normal appearing vagina with normal color and discharge, no lesions              Cervix: no lesions and stenotic               Bimanual Exam:  Uterus:   no masses or tenderness              Adnexa: no mass, fullness, tenderness               Rectovaginal: Confirms               Anus:  normal sphincter tone, no lesions  Gae Dry chaperoned for the exam.  1. Well woman exam Discussed breast self exam Discussed calcium and vit D intake Mammogram and colonoscopy are UTD  2. Screening for cervical cancer - Cytology - PAP  3. Vitamin D deficiency Deficiency in her diet - VITAMIN D 25 Hydroxy (Vit-D Deficiency, Fractures)  4. BMI 34.0-34.9,adult - Hemoglobin A1c - Lipid panel - TSH  5. Laboratory exam ordered as part of routine general medical examination - CBC - Comprehensive metabolic panel - Lipid panel

## 2021-01-13 ENCOUNTER — Other Ambulatory Visit (HOSPITAL_COMMUNITY)
Admission: RE | Admit: 2021-01-13 | Discharge: 2021-01-13 | Disposition: A | Discharge status: Home / Self Care | Payer: BC Managed Care – PPO | Source: Ambulatory Visit | Attending: Obstetrics and Gynecology | Admitting: Obstetrics and Gynecology

## 2021-01-13 ENCOUNTER — Ambulatory Visit: Payer: BC Managed Care – PPO | Admitting: Obstetrics and Gynecology

## 2021-01-13 ENCOUNTER — Other Ambulatory Visit: Payer: Self-pay

## 2021-01-13 ENCOUNTER — Encounter: Payer: Self-pay | Admitting: Obstetrics and Gynecology

## 2021-01-13 VITALS — BP 122/80 | HR 102 | Ht 64.17 in | Wt 202.0 lb

## 2021-01-13 DIAGNOSIS — Z124 Encounter for screening for malignant neoplasm of cervix: Secondary | ICD-10-CM | POA: Diagnosis not present

## 2021-01-13 DIAGNOSIS — E559 Vitamin D deficiency, unspecified: Secondary | ICD-10-CM | POA: Diagnosis not present

## 2021-01-13 DIAGNOSIS — Z6834 Body mass index (BMI) 34.0-34.9, adult: Secondary | ICD-10-CM

## 2021-01-13 DIAGNOSIS — Z0000 Encounter for general adult medical examination without abnormal findings: Secondary | ICD-10-CM

## 2021-01-13 DIAGNOSIS — Z01419 Encounter for gynecological examination (general) (routine) without abnormal findings: Secondary | ICD-10-CM

## 2021-01-13 NOTE — Patient Instructions (Signed)

## 2021-01-14 ENCOUNTER — Encounter: Payer: Self-pay | Admitting: Obstetrics and Gynecology

## 2021-01-14 ENCOUNTER — Telehealth: Payer: Self-pay | Admitting: *Deleted

## 2021-01-14 DIAGNOSIS — E559 Vitamin D deficiency, unspecified: Secondary | ICD-10-CM | POA: Insufficient documentation

## 2021-01-14 DIAGNOSIS — R7303 Prediabetes: Secondary | ICD-10-CM

## 2021-01-14 HISTORY — DX: Vitamin D deficiency, unspecified: E55.9

## 2021-01-14 HISTORY — DX: Prediabetes: R73.03

## 2021-01-14 LAB — LIPID PANEL
Cholesterol: 144 mg/dL (ref <200)
HDL: 55 mg/dL (ref >=50)
LDL Cholesterol (Calc): 66 mg/dL (calc)
Non-HDL Cholesterol (Calc): 89 mg/dL (calc) (ref <130)
Total CHOL/HDL Ratio: 2.6 (calc) (ref <5.0)
Triglycerides: 142 mg/dL (ref <150)

## 2021-01-14 LAB — COMPREHENSIVE METABOLIC PANEL
AG Ratio: 1.3 (calc) (ref 1.0–2.5)
ALT: 11 U/L (ref 6–29)
AST: 17 U/L (ref 10–35)
Albumin: 4.1 g/dL (ref 3.6–5.1)
Alkaline phosphatase (APISO): 78 U/L (ref 37–153)
BUN: 13 mg/dL (ref 7–25)
CO2: 24 mmol/L (ref 20–32)
Calcium: 9.5 mg/dL (ref 8.6–10.4)
Chloride: 104 mmol/L (ref 98–110)
Creat: 0.76 mg/dL (ref 0.50–1.03)
Globulin: 3.2 g/dL (calc) (ref 1.9–3.7)
Glucose, Bld: 91 mg/dL (ref 65–99)
Potassium: 4.3 mmol/L (ref 3.5–5.3)
Sodium: 138 mmol/L (ref 135–146)
Total Bilirubin: 0.5 mg/dL (ref 0.2–1.2)
Total Protein: 7.3 g/dL (ref 6.1–8.1)

## 2021-01-14 LAB — CBC
HCT: 39.9 % (ref 35.0–45.0)
Hemoglobin: 13.2 g/dL (ref 11.7–15.5)
MCH: 27.9 pg (ref 27.0–33.0)
MCHC: 33.1 g/dL (ref 32.0–36.0)
MCV: 84.4 fL (ref 80.0–100.0)
MPV: 10.4 fL (ref 7.5–12.5)
Platelets: 310 10*3/uL (ref 140–400)
RBC: 4.73 10*6/uL (ref 3.80–5.10)
RDW: 13 % (ref 11.0–15.0)
WBC: 8 10*3/uL (ref 3.8–10.8)

## 2021-01-14 LAB — TSH: TSH: 3.13 mIU/L

## 2021-01-14 LAB — HEMOGLOBIN A1C
Hgb A1c MFr Bld: 6.3 % of total Hgb — ABNORMAL HIGH (ref <5.7)
Mean Plasma Glucose: 134 mg/dL
eAG (mmol/L): 7.4 mmol/L

## 2021-01-14 LAB — VITAMIN D 25 HYDROXY (VIT D DEFICIENCY, FRACTURES): Vit D, 25-Hydroxy: 27 ng/mL — ABNORMAL LOW (ref 30–100)

## 2021-01-15 LAB — CYTOLOGY - PAP
Comment: NEGATIVE
Diagnosis: NEGATIVE
High risk HPV: NEGATIVE

## 2021-01-20 NOTE — Telephone Encounter (Signed)
Referral update: prediabetes clinic contacted patient. Pt refused visit. Encounter closed

## 2021-02-10 ENCOUNTER — Encounter: Payer: Self-pay | Admitting: Gastroenterology

## 2021-02-10 ENCOUNTER — Ambulatory Visit: Payer: BC Managed Care – PPO | Admitting: Gastroenterology

## 2021-02-10 VITALS — BP 118/80 | HR 108 | Ht 65.0 in | Wt 202.5 lb

## 2021-02-10 DIAGNOSIS — E559 Vitamin D deficiency, unspecified: Secondary | ICD-10-CM

## 2021-02-10 DIAGNOSIS — K219 Gastro-esophageal reflux disease without esophagitis: Secondary | ICD-10-CM

## 2021-02-10 DIAGNOSIS — K5100 Ulcerative (chronic) pancolitis without complications: Secondary | ICD-10-CM | POA: Diagnosis not present

## 2021-02-10 NOTE — Progress Notes (Signed)
HPI :  55 year old female here for a follow-up visit for colitis.  Recall she was seen by in July 2020 for bowel habit changes. Colonoscopy 09/2018 which showed colitis of the left colon up to 30cm from the anal verge, otherwise fair prep and no other significant inflammatory changes. Biopsies showed chronic active inflammation, moderate to severe in the left colon, mild in the right colon. I had recommended Rowasa and Lialda to treat this initially. She could not afford the Lialda. I had recommended Balsalazide as her insurance covered it.  At our last visit she had been feeling well on the regimen.  We scheduled a surveillance colonoscopy for her in September and she reported feeling poorly at that time on the regimen.  Colonoscopy showed moderately active colitis in the left colon, clearly failing balsalazide/mesalamine, and we discussed options.  Following discussion of options she was transitioned to [facility].  Initially was placed on a prednisone taper after her colonoscopy and then has been receiving Entyvio infusions at the [facility] infusion center.  She has had her initial loading doses x3, now pending her first 8-week dose later this month.  She states she quickly noticed improvement in symptoms and has been doing really well on the regimen.  She tolerates it well without any side effects.  She actually refers the IV infusion now as opposed to oral regimen for management of this.  She is not taking any NSAIDs.  Denies any problems with her bowels.  No blood in her stools.  No abdominal pains.  She was known to be prediabetic with hemoglobin A1c of 6.3, also noted to be vitamin D deficient.  She was started on vitamin D3 2000 units/day and tolerating that well.  Otherwise she has a history of reflux at the last visit since she had gained some weight.  She was previously taking Tums but having more pyrosis that bothered her.  We had discussed options and she tried Pepcid nightly and she states has been  working really well for her.  Denies any breakthrough symptoms, denies any dysphagia.  We discussed options long-term.  Overall she is pretty happy with the regimen so far and feeling well.   Prior procedures: Colonoscopy 10/02/18 - The perianal and digital rectal examinations were normal. - The terminal ileum appeared normal. - Diffuse inflammation characterized by altered vascularity, erosions, erythema, friability and granularity was found in the rectum, in the sigmoid colon, in the descending colon and in the cecal cap. Inflammation was moderate from the distal rectum through 30cm proximal to the dentate line, otherwise mild in the left colon. Suspect the patient may have left sided ulcerative colitis with cecal patch of inflammation, versus less likely Crohn's colitis. Biopsies were taken with a cold forceps for histology. - A large amount of liquid stool was found in the entire colon, making visualization difficult. Lavage of the area was performed using copious amounts of sterile water, resulting in incomplete clearance with fair visualization. - The exam was otherwise without abnormality. No large mass lesions or large polyps noted, but the prep may not have been adequate enough to rule out small or flat polyps in certain portions of the colon.    Colonoscopy 11/12/20 - The perianal and digital rectal examinations were normal. - The terminal ileum appeared normal. - Inflammation was found in a continuous and circumferential pattern from the rectum to the splenic flexure. This was graded as Mayo Score 2 (moderate, with marked erythema, absent vascular pattern, friability, erosions). - There  was some mild erythema in the tranverse and right colon, the exam was otherwise normal throughout the examined colon. - Biopsies were taken with a cold forceps from the right colon, left colon and transverse colon for ulcerative colitis surveillance. These biopsy specimens were sent to  Pathology.   1. Surgical [P], right colon bxs - COLONIC MUCOSA WITH NO SPECIFIC HISTOPATHOLOGIC CHANGES - NEGATIVE FOR ACUTE INFLAMMATION, FEATURES OF CHRONICITY, GRANULOMAS OR DYSPLASIA 2. Surgical [P], transverse colon bxs - PATCHY MILDLY ACTIVE CHRONIC COLITIS, CONSISTENT WITH PATIENT'S CLINICAL HISTORY OF ULCERATIVE COLITIS - NEGATIVE FOR GRANULOMAS OR DYSPLASIA 3. Surgical [P], left colon bxs - MILDLY ACTIVE CHRONIC COLITIS, CONSISTENT WITH PATIENT'S CLINICAL HISTORY OF ULCERATIVE COLITIS - NEGATIVE FOR GRANULOMAS OR DYSPLASIA   QG negative 11/17/20   Past Medical History:  Diagnosis Date   ADHD    Depression    GERD (gastroesophageal reflux disease)    Osteoporosis    Prediabetes 01/14/2021   Ulcerative colitis ([place])    Urinary incontinence    Vitamin D deficiency 01/14/2021     Past Surgical History:  Procedure Laterality Date   APPENDECTOMY     CHOLECYSTECTOMY     COLONOSCOPY     LAPAROSCOPIC APPENDECTOMY N/A 04/25/2017   Procedure: APPENDECTOMY LAPAROSCOPIC;  Surgeon: Greer Pickerel, MD;  Location: [facility];  Service: General;  Laterality: N/A;   MOUTH SURGERY     Family History  Problem Relation Age of Onset   Scoliosis Mother    Osteoporosis Mother    Hypertension Father    Hypertension Maternal Grandmother    Hodgkin's lymphoma Maternal Grandmother    Colon cancer Neg Hx    Stomach cancer Neg Hx    Esophageal cancer Neg Hx    Liver disease Neg Hx    Pancreatic cancer Neg Hx    Social History   Tobacco Use   Smoking status: Never   Smokeless tobacco: Never  Vaping Use   Vaping Use: Never used  Substance Use Topics   Alcohol use: Yes    Comment: occ   Drug use: No   Current Outpatient Medications  Medication Sig Dispense Refill   acetaminophen (TYLENOL) 325 MG tablet You can take 2 tablets every 4-6 hours as needed for pain.  You can alternate with plain Tylenol or Ibuprofen, or the prescribed pain medicine.    DO NOT TAKE MORE THAN 4000 MG OF  TYLENOL PER DAY.  IT CAN HARM YOUR LIVER.  TYLENOL (ACETAMINOPHEN) IS ALSO IN YOUR PRESCRIPTION PAIN MEDICATION.  YOU HAVE TO COUNT IT IN YOUR DAILY TOTAL. (Patient taking differently: You can take 2 tablets every 4-6 hours as needed for pain.  You can alternate with plain Tylenol or Ibuprofen, or the prescribed pain medicine.    DO NOT TAKE MORE THAN 4000 MG OF TYLENOL PER DAY.  IT CAN HARM YOUR LIVER.  TYLENOL (ACETAMINOPHEN) IS ALSO IN YOUR PRESCRIPTION PAIN MEDICATION.  YOU HAVE TO COUNT IT IN YOUR DAILY TOTAL.)     ALPRAZolam (XANAX) 0.25 MG tablet Take 0.25 mg by mouth at bedtime as needed for anxiety.     amphetamine-dextroamphetamine (ADDERALL) 20 MG tablet Take 20 mg by mouth daily.     escitalopram (LEXAPRO) 20 MG tablet Take 20 mg by mouth daily.  5   famotidine (PEPCID) 20 MG tablet Take 1 tablet (20 mg total) by mouth as needed for heartburn or indigestion.     fluticasone (FLONASE) 50 MCG/ACT nasal spray Place 2 sprays into both nostrils daily. (Patient  taking differently: Place 2 sprays into both nostrils daily as needed.) 48 g 3   lisdexamfetamine (VYVANSE) 60 MG capsule Take 60 mg by mouth every morning.     metoprolol succinate (TOPROL-XL) 25 MG 24 hr tablet Take 12.5 mg by mouth daily.     REXULTI 1 MG TABS Take 2 mg by mouth at bedtime.      Vedolizumab (ENTYVIO IV) Inject into the vein every 8 (eight) weeks.     No current facility-administered medications for this visit.   No Known Allergies   Review of Systems: All systems reviewed and negative except where noted in HPI.   Lab Results  Component Value Date   WBC 8.0 01/13/2021   HGB 13.2 01/13/2021   HCT 39.9 01/13/2021   MCV 84.4 01/13/2021   PLT 310 01/13/2021    Lab Results  Component Value Date   CREATININE 0.76 01/13/2021   BUN 13 01/13/2021   NA 138 01/13/2021   K 4.3 01/13/2021   CL 104 01/13/2021   CO2 24 01/13/2021    Lab Results  Component Value Date   ALT 11 01/13/2021   AST 17 01/13/2021    ALKPHOS 71 12/25/2019   BILITOT 0.5 01/13/2021     Physical Exam: BP 118/80   Pulse (!) 108   Ht 5' 5"  (1.651 m)   Wt 202 lb 8 oz (91.9 kg)   LMP  (LMP Unknown)   BMI 33.70 kg/m  Constitutional: Pleasant,well-developed, female in no acute distress. Neurological: Alert and oriented to person place and time. Psychiatric: Normal mood and affect. Behavior is normal.   ASSESSMENT AND PLAN: 55 year old female here for reassessment of the following:  Ulcerative pancolitis (mostly left sided but with disease activity on biopsies of transverse and right colon in the past) GERD Vitamin D deficiency  Unfortunately she failed balsalazide/mesalamine therapy and have pretty active disease on her colonoscopy on this regimen.  We had discussed options and ultimately she was transitioned to [facility].  She has completed loading dosing and doing really well on the regimen so far.  I discussed Entyvio with her, long-term associated risks and benefits.  Safety profile for this drug is excellent, understands increased risk for C. difficile and pharyngitis however so far doing really well.  We will plan on continuing this every 8 weeks.  I like to see her back in 4 to 5 months and will consider scheduling a colonoscopy roughly 6 months from start of therapy to assess for mucosal healing.  If she has any flares of her disease in the interim she should contact me.  We will continue vitamin D supplementation for deficiency.  Otherwise reflux seems very well controlled on Pepcid monotherapy.  We reviewed options for management and if episodes working I would continue it.  Up to her if she wants to take it daily or as needed.  Plan: - continue entyvio - continue vitamin D - continue pepcid - follow up in 4-5 months - repeat colonoscopy in 6 months  Jolly Mango, MD [facility]

## 2021-02-10 NOTE — Patient Instructions (Addendum)
If you are age 55 or older, your body mass index should be between 23-30. Your Body mass index is 33.7 kg/m. If this is out of the aforementioned range listed, please consider follow up with your Primary Care Provider.  If you are age 67 or younger, your body mass index should be between 19-25. Your Body mass index is 33.7 kg/m. If this is out of the aformentioned range listed, please consider follow up with your Primary Care Provider.   ________________________________________________________  The [facility] providers would like to encourage you to use [facility] to communicate with providers for non-urgent requests or questions.  Due to long hold times on the telephone, sending your provider a message by [facility] may be a faster and more efficient way to get a response.  Please allow 48 business hours for a response.  Please remember that this is for non-urgent requests.  _______________________________________________________  Samantha Barry.  Continue Vitamin D and Pepcid.  Please follow up in 4 to 5 months.  Thank you for entrusting me with your care and for choosing [facility], Dr. Baileyville Cellar

## 2021-03-03 ENCOUNTER — Ambulatory Visit (INDEPENDENT_AMBULATORY_CARE_PROVIDER_SITE_OTHER): Payer: BC Managed Care – PPO

## 2021-03-03 ENCOUNTER — Other Ambulatory Visit: Payer: Self-pay

## 2021-03-03 VITALS — BP 111/74 | HR 91 | Temp 98.4°F | Resp 16 | Ht 65.0 in | Wt 209.6 lb

## 2021-03-03 DIAGNOSIS — K5100 Ulcerative (chronic) pancolitis without complications: Secondary | ICD-10-CM

## 2021-03-03 MED ORDER — ALBUTEROL SULFATE HFA 108 (90 BASE) MCG/ACT IN AERS: 2.0000 | INHALATION_SPRAY | Freq: Once | RESPIRATORY_TRACT | Status: DC | PRN

## 2021-03-03 MED ORDER — VEDOLIZUMAB 300 MG IV SOLR
300.0000 mg | Freq: Once | INTRAVENOUS | Status: AC
  Administered 2021-03-03: 14:00:00 300 mg via INTRAVENOUS
  Filled 2021-03-03: qty 5

## 2021-03-03 MED ORDER — DIPHENHYDRAMINE HCL 50 MG/ML IJ SOLN: 50.0000 mg | Freq: Once | INTRAMUSCULAR | Status: DC | PRN

## 2021-03-03 MED ORDER — EPINEPHRINE 0.3 MG/0.3ML IJ SOAJ: 0.3000 mg | Freq: Once | INTRAMUSCULAR | Status: DC | PRN

## 2021-03-03 MED ORDER — FAMOTIDINE IN NACL 20-0.9 MG/50ML-% IV SOLN: 20.0000 mg | Freq: Once | INTRAVENOUS | Status: DC | PRN

## 2021-03-03 MED ORDER — METHYLPREDNISOLONE SODIUM SUCC 125 MG IJ SOLR: 125.0000 mg | Freq: Once | INTRAMUSCULAR | Status: DC | PRN

## 2021-03-03 MED ORDER — SODIUM CHLORIDE 0.9 % IV SOLN: Freq: Once | INTRAVENOUS | Status: DC | PRN

## 2021-03-03 NOTE — Progress Notes (Signed)
Diagnosis: Crohn's Disease  Provider:  Marshell Garfinkel, MD  Procedure: Infusion  IV Type: Peripheral, IV Location: R Antecubital  Entyvio (Vedolizumab), Dose: 300 mg  Infusion Start Time: 2563  Infusion Stop Time: 8937  Post Infusion IV Care: Peripheral IV Discontinued  Discharge: Condition: Good, Destination: Home . AVS provided to patient.   Performed by:  Trichelle Lehan, Sherlon Handing, LPN

## 2021-04-06 ENCOUNTER — Other Ambulatory Visit: Payer: Self-pay | Admitting: Pharmacy Technician

## 2021-04-28 ENCOUNTER — Ambulatory Visit (INDEPENDENT_AMBULATORY_CARE_PROVIDER_SITE_OTHER): Payer: BC Managed Care – PPO

## 2021-04-28 ENCOUNTER — Other Ambulatory Visit: Payer: Self-pay

## 2021-04-28 VITALS — BP 126/87 | HR 78 | Temp 98.3°F | Resp 18 | Ht 65.0 in | Wt 201.4 lb

## 2021-04-28 DIAGNOSIS — K5100 Ulcerative (chronic) pancolitis without complications: Secondary | ICD-10-CM

## 2021-04-28 MED ORDER — EPINEPHRINE 0.3 MG/0.3ML IJ SOAJ: 0.3000 mg | Freq: Once | INTRAMUSCULAR | Status: DC | PRN

## 2021-04-28 MED ORDER — SODIUM CHLORIDE 0.9 % IV SOLN: Freq: Once | INTRAVENOUS | Status: DC | PRN

## 2021-04-28 MED ORDER — METHYLPREDNISOLONE SODIUM SUCC 125 MG IJ SOLR: 125.0000 mg | Freq: Once | INTRAMUSCULAR | Status: DC | PRN

## 2021-04-28 MED ORDER — ALBUTEROL SULFATE HFA 108 (90 BASE) MCG/ACT IN AERS: 2.0000 | INHALATION_SPRAY | Freq: Once | RESPIRATORY_TRACT | Status: DC | PRN

## 2021-04-28 MED ORDER — VEDOLIZUMAB 300 MG IV SOLR
300.0000 mg | Freq: Once | INTRAVENOUS | Status: AC
  Administered 2021-04-28: 300 mg via INTRAVENOUS
  Filled 2021-04-28: qty 5

## 2021-04-28 MED ORDER — FAMOTIDINE IN NACL 20-0.9 MG/50ML-% IV SOLN: 20.0000 mg | Freq: Once | INTRAVENOUS | Status: DC | PRN

## 2021-04-28 MED ORDER — DIPHENHYDRAMINE HCL 50 MG/ML IJ SOLN: 50.0000 mg | Freq: Once | INTRAMUSCULAR | Status: DC | PRN

## 2021-04-28 NOTE — Progress Notes (Signed)
Diagnosis: Crohn's Disease  Provider:  Marshell Garfinkel, MD  Procedure: Infusion  IV Type: Peripheral, IV Location: R Forearm  Entyvio (Vedolizumab), Dose: 300 mg  Infusion Start Time: 14.10  Infusion Stop Time: 14.51  Post Infusion IV Care: Peripheral IV Discontinued  Discharge: Condition: Good, Destination: Home . AVS provided to patient.   Performed by:  Arnoldo Morale, RN

## 2021-06-16 ENCOUNTER — Ambulatory Visit (INDEPENDENT_AMBULATORY_CARE_PROVIDER_SITE_OTHER): Payer: BC Managed Care – PPO

## 2021-06-16 VITALS — BP 111/72 | HR 73 | Temp 98.3°F | Resp 16 | Ht 65.0 in | Wt 200.2 lb

## 2021-06-16 DIAGNOSIS — K5100 Ulcerative (chronic) pancolitis without complications: Secondary | ICD-10-CM

## 2021-06-16 MED ORDER — VEDOLIZUMAB 300 MG IV SOLR
300.0000 mg | Freq: Once | INTRAVENOUS | Status: AC
  Administered 2021-06-16: 300 mg via INTRAVENOUS
  Filled 2021-06-16: qty 5

## 2021-06-16 NOTE — Progress Notes (Signed)
Diagnosis: Crohn's Disease ? ?Provider:  Marshell Garfinkel, MD ? ?Procedure: Infusion ? ?IV Type: Peripheral, IV Location: R Antecubital ? ?Entyvio (Vedolizumab), Dose: 300 mg ? ?Infusion Start Time: 2863 ? ?Infusion Stop Time: 1500 ? ?Post Infusion IV Care: Peripheral IV Discontinued ? ?Discharge: Condition: Good, Destination: Home . AVS provided to patient.  ? ?Performed by:  Cleophus Molt, RN  ?  ?

## 2021-08-11 ENCOUNTER — Ambulatory Visit (INDEPENDENT_AMBULATORY_CARE_PROVIDER_SITE_OTHER): Payer: BC Managed Care – PPO

## 2021-08-11 VITALS — BP 108/74 | HR 78 | Temp 98.0°F | Resp 18 | Ht 65.0 in | Wt 201.0 lb

## 2021-08-11 DIAGNOSIS — K5100 Ulcerative (chronic) pancolitis without complications: Secondary | ICD-10-CM | POA: Diagnosis not present

## 2021-08-11 MED ORDER — VEDOLIZUMAB 300 MG IV SOLR
300.0000 mg | Freq: Once | INTRAVENOUS | Status: AC
  Administered 2021-08-11: 300 mg via INTRAVENOUS
  Filled 2021-08-11: qty 5

## 2021-08-11 NOTE — Progress Notes (Signed)
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 AVS provided to patient.   Performed by:  Cleophus Molt, RN

## 2021-09-28 ENCOUNTER — Encounter: Payer: Self-pay | Admitting: Gastroenterology

## 2021-10-06 ENCOUNTER — Ambulatory Visit (INDEPENDENT_AMBULATORY_CARE_PROVIDER_SITE_OTHER): Payer: BC Managed Care – PPO

## 2021-10-06 VITALS — BP 102/69 | HR 86 | Temp 98.0°F | Resp 20 | Ht 65.0 in | Wt 206.6 lb

## 2021-10-06 DIAGNOSIS — K5100 Ulcerative (chronic) pancolitis without complications: Secondary | ICD-10-CM

## 2021-10-06 MED ORDER — VEDOLIZUMAB 300 MG IV SOLR
300.0000 mg | Freq: Once | INTRAVENOUS | Status: AC
  Administered 2021-10-06: 300 mg via INTRAVENOUS
  Filled 2021-10-06: qty 5

## 2021-10-06 NOTE — Progress Notes (Signed)
Diagnosis: Crohn's Disease  Provider:  Marshell Garfinkel, MD  Procedure: Infusion  IV Type: Peripheral, IV Location: L Antecubital  Entyvio (Vedolizumab), Dose: 300 mg  Infusion Start Time: 5456  Infusion Stop Time: 2563  Post Infusion IV Care: Peripheral IV Discontinued  Discharge: Condition: Good, Destination: Home . AVS provided to patient.   Performed by:  Arnoldo Morale, RN

## 2021-11-18 ENCOUNTER — Other Ambulatory Visit: Payer: Self-pay | Admitting: Pharmacy Technician

## 2021-11-18 ENCOUNTER — Telehealth: Payer: Self-pay | Admitting: Pharmacy Technician

## 2021-11-18 NOTE — Telephone Encounter (Signed)
Pa renewal: entyvio  Auth Submission: APPROVED Payer: [employer]BS Medication & CPT/J Code(s) submitted: Entyvio (Vedolizumab) [id number] Route of submission (phone, fax, portal):  Phone # [phone] Fax # [phone] Auth type: Buy/Bill Units/visits requested: 300MG Q8WKS Reference number: [id number] Approval from: 11/16/21 to 11/15/22

## 2021-11-26 ENCOUNTER — Encounter: Payer: Self-pay | Admitting: Gastroenterology

## 2021-12-01 ENCOUNTER — Ambulatory Visit (INDEPENDENT_AMBULATORY_CARE_PROVIDER_SITE_OTHER): Payer: BC Managed Care – PPO

## 2021-12-01 ENCOUNTER — Other Ambulatory Visit: Payer: Self-pay | Admitting: Pharmacy Technician

## 2021-12-01 VITALS — BP 102/67 | HR 87 | Temp 97.6°F | Resp 18 | Ht 65.0 in | Wt 210.6 lb

## 2021-12-01 DIAGNOSIS — K5100 Ulcerative (chronic) pancolitis without complications: Secondary | ICD-10-CM | POA: Diagnosis not present

## 2021-12-01 MED ORDER — VEDOLIZUMAB 300 MG IV SOLR
300.0000 mg | Freq: Once | INTRAVENOUS | Status: AC
  Administered 2021-12-01: 300 mg via INTRAVENOUS
  Filled 2021-12-01: qty 5

## 2021-12-01 NOTE — Progress Notes (Signed)
Diagnosis: Crohn's Disease  Provider:  Marshell Garfinkel MD  Procedure: Infusion  IV Type: Peripheral, IV Location: R Antecubital  Entyvio (Vedolizumab), Dose: 300 mg  Infusion Start Time: 6599  Infusion Stop Time: 3570  Post Infusion IV Care: Peripheral IV Discontinued  Discharge: Condition: Good, Destination: Home . AVS provided to patient.   Performed by:  Adelina Mings, LPN

## 2022-01-26 ENCOUNTER — Ambulatory Visit (INDEPENDENT_AMBULATORY_CARE_PROVIDER_SITE_OTHER): Payer: BC Managed Care – PPO

## 2022-01-26 VITALS — BP 108/72 | HR 88 | Temp 98.3°F | Resp 18 | Ht 65.0 in | Wt 205.8 lb

## 2022-01-26 DIAGNOSIS — K5100 Ulcerative (chronic) pancolitis without complications: Secondary | ICD-10-CM | POA: Diagnosis not present

## 2022-01-26 MED ORDER — VEDOLIZUMAB 300 MG IV SOLR
300.0000 mg | Freq: Once | INTRAVENOUS | Status: AC
  Administered 2022-01-26: 300 mg via INTRAVENOUS
  Filled 2022-01-26: qty 5

## 2022-01-26 NOTE — Progress Notes (Signed)
Diagnosis: Crohn's Disease  Provider:  Marshell Garfinkel MD  Procedure: Infusion  IV Type: Peripheral, IV Location: R Forearm  Entyvio (Vedolizumab), Dose: 300 mg  Infusion Start Time: 0638  Infusion Stop Time: 1450  Post Infusion IV Care: Peripheral IV Discontinued  Discharge: Condition: Good, Destination: Home . AVS provided to patient.   Performed by:  Cleophus Molt, RN

## 2022-02-26 ENCOUNTER — Ambulatory Visit (INDEPENDENT_AMBULATORY_CARE_PROVIDER_SITE_OTHER): Payer: BC Managed Care – PPO | Admitting: Gastroenterology

## 2022-02-26 ENCOUNTER — Other Ambulatory Visit (INDEPENDENT_AMBULATORY_CARE_PROVIDER_SITE_OTHER): Payer: BC Managed Care – PPO

## 2022-02-26 ENCOUNTER — Encounter: Payer: Self-pay | Admitting: Gastroenterology

## 2022-02-26 VITALS — BP 118/78 | HR 105 | Ht 65.0 in | Wt 206.0 lb

## 2022-02-26 DIAGNOSIS — K5100 Ulcerative (chronic) pancolitis without complications: Secondary | ICD-10-CM

## 2022-02-26 DIAGNOSIS — K219 Gastro-esophageal reflux disease without esophagitis: Secondary | ICD-10-CM

## 2022-02-26 DIAGNOSIS — E559 Vitamin D deficiency, unspecified: Secondary | ICD-10-CM

## 2022-02-26 LAB — CBC WITH DIFFERENTIAL/PLATELET
Basophils Absolute: 0.1 10*3/uL (ref 0.0–0.1)
Basophils Relative: 1.9 % (ref 0.0–3.0)
Eosinophils Absolute: 0.4 10*3/uL (ref 0.0–0.7)
Eosinophils Relative: 5.8 % — ABNORMAL HIGH (ref 0.0–5.0)
HCT: 33.9 % — ABNORMAL LOW (ref 36.0–46.0)
Hemoglobin: 11.1 g/dL — ABNORMAL LOW (ref 12.0–15.0)
Lymphocytes Relative: 33.4 % (ref 12.0–46.0)
Lymphs Abs: 2.4 10*3/uL (ref 0.7–4.0)
MCHC: 32.8 g/dL (ref 30.0–36.0)
MCV: 82 fl (ref 78.0–100.0)
Monocytes Absolute: 0.9 10*3/uL (ref 0.1–1.0)
Monocytes Relative: 13.1 % — ABNORMAL HIGH (ref 3.0–12.0)
Neutro Abs: 3.3 10*3/uL (ref 1.4–7.7)
Neutrophils Relative %: 45.8 % (ref 43.0–77.0)
Platelets: 306 10*3/uL (ref 150.0–400.0)
RBC: 4.13 Mil/uL (ref 3.87–5.11)
RDW: 14.5 % (ref 11.5–15.5)
WBC: 7.1 10*3/uL (ref 4.0–10.5)

## 2022-02-26 LAB — COMPREHENSIVE METABOLIC PANEL
ALT: 12 U/L (ref 0–35)
AST: 16 U/L (ref 0–37)
Albumin: 3.9 g/dL (ref 3.5–5.2)
Alkaline Phosphatase: 79 U/L (ref 39–117)
BUN: 12 mg/dL (ref 6–23)
CO2: 27 mEq/L (ref 19–32)
Calcium: 9.2 mg/dL (ref 8.4–10.5)
Chloride: 104 mEq/L (ref 96–112)
Creatinine, Ser: 0.88 mg/dL (ref 0.40–1.20)
GFR: 73.38 mL/min (ref >60.00)
Glucose, Bld: 95 mg/dL (ref 70–99)
Potassium: 4.5 mEq/L (ref 3.5–5.1)
Sodium: 138 mEq/L (ref 135–145)
Total Bilirubin: 0.4 mg/dL (ref 0.2–1.2)
Total Protein: 7.2 g/dL (ref 6.0–8.3)

## 2022-02-26 NOTE — Patient Instructions (Signed)
_______________________________________________________  If you are age 56 or older, your body mass index should be between 23-30. Your Body mass index is 34.28 kg/m. If this is out of the aforementioned range listed, please consider follow up with your Primary Care Provider.  If you are age 21 or younger, your body mass index should be between 19-25. Your Body mass index is 34.28 kg/m. If this is out of the aformentioned range listed, please consider follow up with your Primary Care Provider.   ________________________________________________________  The [place] [facility] providers would like to encourage you to use [facility] to communicate with providers for non-urgent requests or questions.  Due to long hold times on the telephone, sending your provider a message by [facility] may be a faster and more efficient way to get a response.  Please allow 48 business hours for a response.  Please remember that this is for non-urgent requests.  _______________________________________________________  It has been recommended to you by your physician that you have a(n) Colonoscopy completed. Per your request, we did not schedule the procedure(s) today. Please contact our office at [phone] should you decide to have the procedure completed. You will be scheduled for a pre-visit and procedure at that time.  Your provider has requested that you go to the basement level for lab work before leaving today. Press "B" on the elevator. The lab is located at the first door on the left as you exit the elevator.  Due to recent changes in healthcare laws, you may see the results of your imaging and laboratory studies on MyChart before your provider has had a chance to review them.  We understand that in some cases there may be results that are confusing or concerning to you. Not all laboratory results come back in the same time frame and the provider may be waiting for multiple results in order to interpret others.  Please  give [place] 48 hours in order for your provider to thoroughly review all the results before contacting the office for clarification of your results.   It was a pleasure to see you today!  Thank you for trusting me with your gastrointestinal care!

## 2022-02-26 NOTE — Progress Notes (Signed)
HPI :  Colitis history: Seen July 2020 for bowel habit changes. Colonoscopy 09/2018 showed colitis of the left colon up to 30cm from the anal verge, otherwise fair prep and no other significant inflammatory changes. Biopsies showed chronic active inflammation, moderate to severe in the left colon, mild in the right colon. I had recommended Rowasa and Lialda to treat this initially. She could not afford the Lialda. I had recommended Balsalazide as her insurance covered it. Colonoscopy on that regimen showed moderately active colitis in the left colon, clearly failing balsalazide/mesalamine, and we discussed options.  She was transitioned to [place] at the end of 2022 and I have not seen her since that time.  She has also had vitamin D deficiency over time.  SINCE LAST VISIT:  56 year old female here for a follow-up visit.  I have not seen her in over a year.  She has been doing well since have last seen her.  She was transition to [place] at the end of last year.  She has an infusion every 8 weeks and is tolerating the infusions well without any side effects that she experiences.  She has not had any flares of her colitis since have seen her.  She has 1 bowel movement per day, without blood.  No urgency.  No abdominal pains.  Generally she is quite happy with the regimen.  She continues to take vitamin D supplementation, she thinks about 1000 units/day.  She does have a history of GERD, has been using Pepcid nightly for this and it works pretty well.  She inquires about long-term risks, she is heard about PPI use and dementia.  She denies any dysphagia.  No prior EGDs.  We discussed if she wanted to Barrett's screening.  Otherwise in good health today without complaints.  She has not had lab work done in over a year.    Prior procedures: Colonoscopy 10/02/18 - The perianal and digital rectal examinations were normal. - The terminal ileum appeared normal. - Diffuse inflammation characterized by  altered vascularity, erosions, erythema, friability and granularity was found in the rectum, in the sigmoid colon, in the descending colon and in the cecal cap. Inflammation was moderate from the distal rectum through 30cm proximal to the dentate line, otherwise mild in the left colon. Suspect the patient may have left sided ulcerative colitis with cecal patch of inflammation, versus less likely Crohn's colitis. Biopsies were taken with a cold forceps for histology. - A large amount of liquid stool was found in the entire colon, making visualization difficult. Lavage of the area was performed using copious amounts of sterile water, resulting in incomplete clearance with fair visualization. - The exam was otherwise without abnormality. No large mass lesions or large polyps noted, but the prep may not have been adequate enough to rule out small or flat polyps in certain portions of the colon.     Colonoscopy 11/12/20 - The perianal and digital rectal examinations were normal. - The terminal ileum appeared normal. - Inflammation was found in a continuous and circumferential pattern from the rectum to the splenic flexure. This was graded as Mayo Score 2 (moderate, with marked erythema, absent vascular pattern, friability, erosions). - There was some mild erythema in the tranverse and right colon, the exam was otherwise normal throughout the examined colon. - Biopsies were taken with a cold forceps from the right colon, left colon and transverse colon for ulcerative colitis surveillance. These biopsy specimens were sent to Pathology.     1.  Surgical [P], right colon bxs - COLONIC MUCOSA WITH NO SPECIFIC HISTOPATHOLOGIC CHANGES - NEGATIVE FOR ACUTE INFLAMMATION, FEATURES OF CHRONICITY, GRANULOMAS OR DYSPLASIA 2. Surgical [P], transverse colon bxs - PATCHY MILDLY ACTIVE CHRONIC COLITIS, CONSISTENT WITH PATIENT'S CLINICAL HISTORY OF ULCERATIVE COLITIS - NEGATIVE FOR GRANULOMAS OR DYSPLASIA 3.  Surgical [P], left colon bxs - MILDLY ACTIVE CHRONIC COLITIS, CONSISTENT WITH PATIENT'S CLINICAL HISTORY OF ULCERATIVE COLITIS - NEGATIVE FOR GRANULOMAS OR DYSPLASIA     QG negative 11/17/20    Past Medical History:  Diagnosis Date   ADHD    Depression    GERD (gastroesophageal reflux disease)    Osteoporosis    Prediabetes 01/14/2021   Ulcerative colitis ([place])    Urinary incontinence    Vitamin D deficiency 01/14/2021     Past Surgical History:  Procedure Laterality Date   APPENDECTOMY     CHOLECYSTECTOMY     COLONOSCOPY     LAPAROSCOPIC APPENDECTOMY N/A 04/25/2017   Procedure: APPENDECTOMY LAPAROSCOPIC;  Surgeon: Greer Pickerel, MD;  Location: [facility];  Service: General;  Laterality: N/A;   MOUTH SURGERY     Family History  Problem Relation Age of Onset   Scoliosis Mother    Osteoporosis Mother    Hypertension Father    Hypertension Maternal Grandmother    Hodgkin's lymphoma Maternal Grandmother    Colon cancer Neg Hx    Stomach cancer Neg Hx    Esophageal cancer Neg Hx    Liver disease Neg Hx    Pancreatic cancer Neg Hx    Social History   Tobacco Use   Smoking status: Never   Smokeless tobacco: Never  Vaping Use   Vaping Use: Never used  Substance Use Topics   Alcohol use: Yes    Comment: occ   Drug use: No   Current Outpatient Medications  Medication Sig Dispense Refill   acetaminophen (TYLENOL) 325 MG tablet You can take 2 tablets every 4-6 hours as needed for pain.  You can alternate with plain Tylenol or Ibuprofen, or the prescribed pain medicine.    DO NOT TAKE MORE THAN 4000 MG OF TYLENOL PER DAY.  IT CAN HARM YOUR LIVER.  TYLENOL (ACETAMINOPHEN) IS ALSO IN YOUR PRESCRIPTION PAIN MEDICATION.  YOU HAVE TO COUNT IT IN YOUR DAILY TOTAL. (Patient taking differently: You can take 2 tablets every 4-6 hours as needed for pain.  You can alternate with plain Tylenol or Ibuprofen, or the prescribed pain medicine.    DO NOT TAKE MORE THAN 4000 MG OF TYLENOL PER  DAY.  IT CAN HARM YOUR LIVER.  TYLENOL (ACETAMINOPHEN) IS ALSO IN YOUR PRESCRIPTION PAIN MEDICATION.  YOU HAVE TO COUNT IT IN YOUR DAILY TOTAL.)     ALPRAZolam (XANAX) 0.25 MG tablet Take 0.25 mg by mouth at bedtime as needed for anxiety.     amphetamine-dextroamphetamine (ADDERALL) 20 MG tablet Take 20 mg by mouth daily.     escitalopram (LEXAPRO) 20 MG tablet Take 20 mg by mouth daily.  5   famotidine (PEPCID) 20 MG tablet Take 1 tablet (20 mg total) by mouth as needed for heartburn or indigestion.     fluticasone (FLONASE) 50 MCG/ACT nasal spray Place 2 sprays into both nostrils daily. (Patient taking differently: Place 2 sprays into both nostrils daily as needed.) 48 g 3   lisdexamfetamine (VYVANSE) 60 MG capsule Take 60 mg by mouth every morning.     metoprolol succinate (TOPROL-XL) 25 MG 24 hr tablet Take 12.5 mg by mouth daily.  REXULTI 1 MG TABS Take 2 mg by mouth at bedtime.      REXULTI 2 MG TABS tablet Take 2 mg by mouth at bedtime.     Vedolizumab (ENTYVIO IV) Inject into the vein every 8 (eight) weeks.     No current facility-administered medications for this visit.   No Known Allergies   Review of Systems: All systems reviewed and negative except where noted in HPI.   Lab Results  Component Value Date   WBC 8.0 01/13/2021   HGB 13.2 01/13/2021   HCT 39.9 01/13/2021   MCV 84.4 01/13/2021   PLT 310 01/13/2021    Lab Results  Component Value Date   CREATININE 0.76 01/13/2021   BUN 13 01/13/2021   NA 138 01/13/2021   K 4.3 01/13/2021   CL 104 01/13/2021   CO2 24 01/13/2021    Lab Results  Component Value Date   ALT 11 01/13/2021   AST 17 01/13/2021   ALKPHOS 71 12/25/2019   BILITOT 0.5 01/13/2021     Physical Exam: BP 118/78   Pulse (!) 105   Ht 5' 5"  (1.651 m)   Wt 206 lb (93.4 kg)   LMP  (LMP Unknown)   SpO2 98%   BMI 34.28 kg/m  Constitutional: Pleasant,well-developed, female in no acute distress. Neurological: Alert and oriented to person  place and time. Psychiatric: Normal mood and affect. Behavior is normal.   ASSESSMENT: 56 y.o. female here for assessment of the following  1. Ulcerative pancolitis without complication ([place])   2. Gastroesophageal reflux disease, unspecified whether esophagitis present   3. Vitamin D deficiency    Ulcerative pancolitis as outlined above.  She failed initial therapy with mesalamine based regimen as above.  Transition to [facility] a bit over a year ago and has been on that regimen since then with excellent control of symptoms and no flares.  We have discussed goals of therapy to include symptomatic/clinical remission and endoscopic remission to reduce her risk for colon cancer over time.  She is tolerating Entyvio, reviewed risks, she understands, this is a very safe drug and as long as she is tolerating it and it is working we will continue it.  At this point, I do recommend a surveillance colonoscopy to assess for mucosal healing.  I discussed risk benefits of colonoscopy and anesthesia and she wants to proceed.  We will have her go to the lab for her yearly blood work to include CBC and CMET, I will also check vitamin D levels and if she remains deficient we will need to increase her dosing of vitamin D.  Otherwise reviewed her history of reflux.  She has had symptoms for some time, age over 44, for more than 5 years of symptoms, with BMI of 35, she does meet criteria for screening endoscopy.  We discussed endoscopy and reasons to screen for Barrett's.  She understands this and wishes to hold off for now.  We discussed long-term regimen, as long as Pepcid is working for her that is reasonable to continue at.  We discussed PPIs and risks of those which she has heard about, she wants to hold off on PPI as Pepcid is working to control her symptoms.  We will reassess her for consideration for Barrett's screening at a future visit.   PLAN: - colonoscopy at the [facility] - continue Entyvio every 8 weeks - lab  for CBC, CMET, and vitamin D - continue pepcid - offered screening endoscopy to evaluate for Barrett's, she  declines at this time. Discussed long term reflux regimen as outlined above.  Jolly Mango, MD [facility]

## 2022-02-27 LAB — VITAMIN D 25 HYDROXY (VIT D DEFICIENCY, FRACTURES): VITD: 26.91 ng/mL — ABNORMAL LOW (ref 30.00–100.00)

## 2022-03-03 ENCOUNTER — Other Ambulatory Visit: Payer: Self-pay

## 2022-03-03 DIAGNOSIS — D649 Anemia, unspecified: Secondary | ICD-10-CM

## 2022-03-03 DIAGNOSIS — E559 Vitamin D deficiency, unspecified: Secondary | ICD-10-CM

## 2022-03-04 ENCOUNTER — Other Ambulatory Visit: Payer: Self-pay

## 2022-03-04 MED ORDER — VITAMIN D3 1000 UNITS PO CAPS: 2.0000 | ORAL_CAPSULE | Freq: Every day | ORAL | 0 refills | Status: AC

## 2022-03-23 ENCOUNTER — Ambulatory Visit (INDEPENDENT_AMBULATORY_CARE_PROVIDER_SITE_OTHER): Payer: BC Managed Care – PPO

## 2022-03-23 VITALS — BP 117/77 | HR 88 | Temp 98.1°F | Resp 18 | Ht 65.0 in | Wt 208.8 lb

## 2022-03-23 DIAGNOSIS — K5100 Ulcerative (chronic) pancolitis without complications: Secondary | ICD-10-CM | POA: Diagnosis not present

## 2022-03-23 MED ORDER — VEDOLIZUMAB 300 MG IV SOLR
300.0000 mg | Freq: Once | INTRAVENOUS | Status: AC
  Administered 2022-03-23: 300 mg via INTRAVENOUS
  Filled 2022-03-23: qty 5

## 2022-03-23 NOTE — Progress Notes (Signed)
Diagnosis: Crohn's Disease  Provider:  Marshell Garfinkel MD  Procedure: Infusion  IV Type: Peripheral, IV Location: L Upper Arm  Entyvio (Vedolizumab), Dose: 300 mg  Infusion Start Time: 1638  Infusion Stop Time: 4536  Post Infusion IV Care: Peripheral IV Discontinued  Discharge: Condition: Good, Destination: Home . AVS provided to patient.   Performed by:  Cleophus Molt, RN

## 2022-04-12 ENCOUNTER — Telehealth: Payer: Self-pay

## 2022-04-12 NOTE — Telephone Encounter (Signed)
Called patient and left message to call back to be scheduled for colonoscopy and Previsit. She is due for colonoscopy for Ulcerative pancolitis. She is not on Blood thinner

## 2022-04-12 NOTE — Telephone Encounter (Signed)
-----   Message from Larina Bras, Tom Green sent at 02/26/2022 11:27 AM EST ----- Patient came in office 02/26/22 and was recommended for colonoscopy. She wishes to wait until March 2024 to schedule.

## 2022-04-19 ENCOUNTER — Other Ambulatory Visit (HOSPITAL_BASED_OUTPATIENT_CLINIC_OR_DEPARTMENT_OTHER): Payer: Self-pay

## 2022-04-19 ENCOUNTER — Encounter: Payer: Self-pay | Admitting: Gastroenterology

## 2022-04-19 MED ORDER — LISDEXAMFETAMINE DIMESYLATE 60 MG PO CAPS: 60.0000 mg | ORAL_CAPSULE | Freq: Every day | ORAL | 0 refills | Status: DC

## 2022-04-20 ENCOUNTER — Other Ambulatory Visit (HOSPITAL_BASED_OUTPATIENT_CLINIC_OR_DEPARTMENT_OTHER): Payer: Self-pay

## 2022-04-20 MED ORDER — LISDEXAMFETAMINE DIMESYLATE 60 MG PO CAPS
60.0000 mg | ORAL_CAPSULE | Freq: Every day | ORAL | 0 refills | Status: DC
  Filled 2022-04-20: qty 30, 30d supply, fill #0

## 2022-05-18 ENCOUNTER — Ambulatory Visit (INDEPENDENT_AMBULATORY_CARE_PROVIDER_SITE_OTHER): Payer: BC Managed Care – PPO

## 2022-05-18 VITALS — BP 102/65 | HR 87 | Temp 97.9°F | Resp 16 | Ht 65.0 in | Wt 208.0 lb

## 2022-05-18 DIAGNOSIS — K5100 Ulcerative (chronic) pancolitis without complications: Secondary | ICD-10-CM | POA: Diagnosis not present

## 2022-05-18 MED ORDER — VEDOLIZUMAB 300 MG IV SOLR
300.0000 mg | Freq: Once | INTRAVENOUS | Status: AC
  Administered 2022-05-18: 300 mg via INTRAVENOUS
  Filled 2022-05-18: qty 5

## 2022-05-18 NOTE — Progress Notes (Signed)
Diagnosis: Ulcerative Colitis  Provider:  Marshell Garfinkel MD  Procedure: Infusion  IV Type: Peripheral, IV Location: R Antecubital  Entyvio (Vedolizumab), Dose: 300 mg  Infusion Start Time: [id number]  Infusion Stop Time: [id number]  Post Infusion IV Care: Peripheral IV Discontinued  Discharge: Condition: Good, Destination: Home . AVS Provided and AVS Declined  Performed by:  Paul Dykes, RN

## 2022-05-20 ENCOUNTER — Other Ambulatory Visit (HOSPITAL_COMMUNITY): Payer: Self-pay

## 2022-05-20 MED ORDER — LISDEXAMFETAMINE DIMESYLATE 60 MG PO CAPS
60.0000 mg | ORAL_CAPSULE | Freq: Every day | ORAL | 0 refills | Status: DC
  Filled 2022-05-20: qty 30, 30d supply, fill #0

## 2022-05-20 MED ORDER — AMPHETAMINE-DEXTROAMPHETAMINE 20 MG PO TABS
20.0000 mg | ORAL_TABLET | Freq: Every day | ORAL | 0 refills | Status: DC
  Filled 2022-05-20: qty 30, 30d supply, fill #0

## 2022-05-27 ENCOUNTER — Other Ambulatory Visit (HOSPITAL_COMMUNITY): Payer: Self-pay

## 2022-05-27 MED ORDER — LISDEXAMFETAMINE DIMESYLATE 60 MG PO CAPS
60.0000 mg | ORAL_CAPSULE | Freq: Every day | ORAL | 0 refills | Status: AC
  Filled 2022-07-19: qty 30, 30d supply, fill #0

## 2022-05-27 MED ORDER — LISDEXAMFETAMINE DIMESYLATE 60 MG PO CAPS
60.0000 mg | ORAL_CAPSULE | Freq: Every day | ORAL | 0 refills | Status: DC
  Filled 2022-08-18: qty 30, 30d supply, fill #0

## 2022-05-27 MED ORDER — LISDEXAMFETAMINE DIMESYLATE 60 MG PO CAPS
60.0000 mg | ORAL_CAPSULE | Freq: Every day | ORAL | 0 refills | Status: DC
  Filled 2022-05-27: qty 30, 30d supply, fill #0
  Filled 2022-06-21: qty 10, 10d supply, fill #0
  Filled 2022-06-21: qty 20, 20d supply, fill #0

## 2022-05-27 MED ORDER — AMPHETAMINE-DEXTROAMPHETAMINE 20 MG PO TABS
20.0000 mg | ORAL_TABLET | Freq: Every day | ORAL | 0 refills | Status: DC
  Filled 2022-05-27 – 2022-06-21 (×2): qty 30, 30d supply, fill #0

## 2022-05-27 MED ORDER — AMPHETAMINE-DEXTROAMPHETAMINE 20 MG PO TABS
20.0000 mg | ORAL_TABLET | Freq: Every day | ORAL | 0 refills | Status: DC
  Filled 2022-07-19: qty 30, 30d supply, fill #0

## 2022-05-27 MED ORDER — AMPHETAMINE-DEXTROAMPHETAMINE 20 MG PO TABS
20.0000 mg | ORAL_TABLET | Freq: Every day | ORAL | 0 refills | Status: AC
  Filled 2022-08-18: qty 30, 30d supply, fill #0

## 2022-06-21 ENCOUNTER — Other Ambulatory Visit (HOSPITAL_COMMUNITY): Payer: Self-pay

## 2022-07-13 ENCOUNTER — Ambulatory Visit (INDEPENDENT_AMBULATORY_CARE_PROVIDER_SITE_OTHER): Payer: BC Managed Care – PPO | Admitting: *Deleted

## 2022-07-13 VITALS — BP 111/74 | HR 88 | Temp 98.7°F | Resp 16 | Ht 65.0 in | Wt 208.2 lb

## 2022-07-13 DIAGNOSIS — K5100 Ulcerative (chronic) pancolitis without complications: Secondary | ICD-10-CM

## 2022-07-13 MED ORDER — VEDOLIZUMAB 300 MG IV SOLR
300.0000 mg | Freq: Once | INTRAVENOUS | Status: AC
  Administered 2022-07-13: 300 mg via INTRAVENOUS
  Filled 2022-07-13: qty 5

## 2022-07-13 NOTE — Progress Notes (Signed)
Diagnosis: Crohn's Disease  Provider:  Chilton Greathouse MD  Procedure: IV Infusion  IV Type: Peripheral, IV Location: L Antecubital  Entyvio (Vedolizumab), Dose: 300 mg  Infusion Start Time: 1415 pm  Infusion Stop Time: 1450 pm  Post Infusion IV Care: Observation period completed and Peripheral IV Discontinued  Discharge: Condition: Good, Destination: Home . AVS Provided  Performed by:  Forrest Moron, RN

## 2022-07-19 ENCOUNTER — Other Ambulatory Visit: Payer: Self-pay

## 2022-07-19 ENCOUNTER — Other Ambulatory Visit (HOSPITAL_COMMUNITY): Payer: Self-pay

## 2022-08-18 ENCOUNTER — Other Ambulatory Visit (HOSPITAL_COMMUNITY): Payer: Self-pay

## 2022-08-20 ENCOUNTER — Other Ambulatory Visit (HOSPITAL_COMMUNITY): Payer: Self-pay

## 2022-09-06 ENCOUNTER — Encounter: Payer: Self-pay | Admitting: Gastroenterology

## 2022-09-07 ENCOUNTER — Ambulatory Visit (INDEPENDENT_AMBULATORY_CARE_PROVIDER_SITE_OTHER): Payer: BC Managed Care – PPO

## 2022-09-07 VITALS — BP 104/71 | HR 87 | Temp 98.0°F | Resp 16 | Ht 65.0 in | Wt 204.6 lb

## 2022-09-07 DIAGNOSIS — K5100 Ulcerative (chronic) pancolitis without complications: Secondary | ICD-10-CM | POA: Diagnosis not present

## 2022-09-07 MED ORDER — VEDOLIZUMAB 300 MG IV SOLR
300.0000 mg | Freq: Once | INTRAVENOUS | Status: AC
  Administered 2022-09-07: 300 mg via INTRAVENOUS
  Filled 2022-09-07: qty 5

## 2022-09-07 NOTE — Progress Notes (Signed)
Diagnosis: Crohn's Disease  Provider:  Chilton Greathouse MD  Procedure: IV Infusion  IV Type: Peripheral, IV Location: L Antecubital  Entyvio (Vedolizumab), Dose: 300 mg  Infusion Start Time: 1356  Infusion Stop Time: 1430  Post Infusion IV Care: Peripheral IV Discontinued  Discharge: Condition: Good, Destination: Home . AVS Declined  Performed by:  Loney Hering, LPN

## 2022-09-24 ENCOUNTER — Other Ambulatory Visit (HOSPITAL_COMMUNITY): Payer: Self-pay

## 2022-09-24 MED ORDER — ALPRAZOLAM 0.5 MG PO TABS
ORAL_TABLET | ORAL | 0 refills | Status: AC
  Filled 2022-09-24: qty 10, 5d supply, fill #0

## 2022-09-25 ENCOUNTER — Other Ambulatory Visit (HOSPITAL_COMMUNITY): Payer: Self-pay

## 2022-10-18 ENCOUNTER — Encounter: Payer: Self-pay | Admitting: Gastroenterology

## 2022-10-21 ENCOUNTER — Encounter: Payer: Self-pay | Admitting: Gastroenterology

## 2022-10-21 ENCOUNTER — Ambulatory Visit (AMBULATORY_SURGERY_CENTER): Payer: BC Managed Care – PPO

## 2022-10-21 VITALS — Ht 65.0 in | Wt 197.0 lb

## 2022-10-21 DIAGNOSIS — K5100 Ulcerative (chronic) pancolitis without complications: Secondary | ICD-10-CM

## 2022-10-21 MED ORDER — NA SULFATE-K SULFATE-MG SULF 17.5-3.13-1.6 GM/177ML PO SOLN: 1.0000 | Freq: Once | ORAL | 0 refills | Status: AC

## 2022-10-21 NOTE — Progress Notes (Signed)

## 2022-11-02 ENCOUNTER — Ambulatory Visit (INDEPENDENT_AMBULATORY_CARE_PROVIDER_SITE_OTHER): Payer: BC Managed Care – PPO

## 2022-11-02 VITALS — BP 104/69 | HR 84 | Temp 98.3°F | Resp 16 | Ht 65.0 in | Wt 199.4 lb

## 2022-11-02 DIAGNOSIS — K5100 Ulcerative (chronic) pancolitis without complications: Secondary | ICD-10-CM | POA: Diagnosis not present

## 2022-11-02 MED ORDER — VEDOLIZUMAB 300 MG IV SOLR
300.0000 mg | Freq: Once | INTRAVENOUS | Status: AC
  Administered 2022-11-02: 300 mg via INTRAVENOUS
  Filled 2022-11-02: qty 5

## 2022-11-02 NOTE — Progress Notes (Signed)
Diagnosis: Ulcerative Colitis  Provider:  Chilton Greathouse MD  Procedure: IV Infusion  IV Type: Peripheral, IV Location: R Forearm  Entyvio (Vedolizumab), Dose: 300 mg  Infusion Start Time: 1406  Infusion Stop Time: 1439  Post Infusion IV Care: Peripheral IV Discontinued  Discharge: Condition: Good, Destination: Home . AVS Declined  Performed by:  Nat Math, RN

## 2022-11-10 ENCOUNTER — Telehealth: Payer: Self-pay | Admitting: Pharmacy Technician

## 2022-11-10 NOTE — Telephone Encounter (Signed)
Auth Submission: APPROVED [place] RENEWAL  Site of care: Site of care: [facility] Payer: [employer]BS Medication & CPT/J Code(s) submitted: Entyvio (Vedolizumab) [id number] Route of submission (phone, fax, portal):  Phone # Fax #[phone] Auth type: Buy/Bill PB Units/visits requested: 2400 UNITS 300MG  [id number] Reference number:  Approval from: 11/09/22 to 11/09/23

## 2022-11-12 ENCOUNTER — Telehealth: Payer: Self-pay | Admitting: Gastroenterology

## 2022-11-12 ENCOUNTER — Other Ambulatory Visit: Payer: Self-pay

## 2022-11-12 DIAGNOSIS — K5100 Ulcerative (chronic) pancolitis without complications: Secondary | ICD-10-CM

## 2022-11-12 NOTE — Telephone Encounter (Signed)
New prep instructions sent via mychart to reflect prep medication. Pt is aware and agreeable.

## 2022-11-12 NOTE — Telephone Encounter (Signed)
Patient called requesting to speak with a nurse stated the pharmacy gave her a liquid prep and not Sutab.

## 2022-11-19 ENCOUNTER — Encounter: Payer: Self-pay | Admitting: Gastroenterology

## 2022-11-19 ENCOUNTER — Ambulatory Visit (AMBULATORY_SURGERY_CENTER): Payer: BC Managed Care – PPO | Admitting: Gastroenterology

## 2022-11-19 VITALS — BP 108/74 | HR 79 | Temp 98.0°F | Resp 11 | Ht 65.0 in | Wt 197.0 lb

## 2022-11-19 DIAGNOSIS — K5100 Ulcerative (chronic) pancolitis without complications: Secondary | ICD-10-CM

## 2022-11-19 DIAGNOSIS — K635 Polyp of colon: Secondary | ICD-10-CM

## 2022-11-19 DIAGNOSIS — D123 Benign neoplasm of transverse colon: Secondary | ICD-10-CM

## 2022-11-19 MED ORDER — SODIUM CHLORIDE 0.9 % IV SOLN: 500.0000 mL | Freq: Once | INTRAVENOUS | Status: AC

## 2022-11-19 NOTE — Progress Notes (Signed)
[facility] History and Physical   Primary Care Physician:  Doristine Bosworth, MD (Inactive)   Reason for Procedure:   Ulcerative colitis - assess disease activity  Plan:    colonoscopy     HPI: Samantha Barry is a 57 y.o. female  here for colonoscopy surveillance - history of ulcerative pancolitis, now on Entyvio and here for colonoscopy to assess disease activity.   Patient denies any bowel symptoms at this time. Otherwise feels well without any cardiopulmonary symptoms.   I have discussed risks / benefits of anesthesia and endoscopic procedure with [facility] and they wish to proceed with the exams as outlined today.    Past Medical History:  Diagnosis Date   ADHD    Depression    GERD (gastroesophageal reflux disease)    Osteoporosis    Prediabetes 01/14/2021   Ulcerative colitis ([facility])    Urinary incontinence    Vitamin D deficiency 01/14/2021    Past Surgical History:  Procedure Laterality Date   APPENDECTOMY     CHOLECYSTECTOMY     COLONOSCOPY     LAPAROSCOPIC APPENDECTOMY N/A 04/25/2017   Procedure: APPENDECTOMY LAPAROSCOPIC;  Surgeon: Gaynelle Adu, MD;  Location: [facility];  Service: General;  Laterality: N/A;   MOUTH SURGERY      Prior to Admission medications   Medication Sig Start Date End Date Taking? Authorizing Provider  amphetamine-dextroamphetamine (ADDERALL) 20 MG tablet Take 1 tablet (20 mg) by mouth daily in the afternoon. 07/27/22 07/27/22  Yes   Cholecalciferol (VITAMIN D3) 1000 units CAPS Take 2 capsules by mouth daily. (2,000 IU total) 03/04/22  Yes Velita Quirk, Willaim Rayas, MD  escitalopram (LEXAPRO) 20 MG tablet Take 20 mg by mouth daily. 04/07/17  Yes [provider]  famotidine (PEPCID) 20 MG tablet Take 1 tablet (20 mg total) by mouth as needed for heartburn or indigestion. 12/25/19  Yes Iisha Soyars, Willaim Rayas, MD  lisdexamfetamine (VYVANSE) 60 MG capsule Take 1 capsule (60 mg total) by mouth daily. 06/27/22  Yes   metoprolol  succinate (TOPROL-XL) 25 MG 24 hr tablet Take 12.5 mg by mouth daily. 11/19/20  Yes [provider]  REXULTI 2 MG TABS tablet Take 2 mg by mouth at bedtime. 12/17/21  Yes [provider]  acetaminophen (TYLENOL) 325 MG tablet You can take 2 tablets every 4-6 hours as needed for pain.  You can alternate with plain Tylenol or Ibuprofen, or the prescribed pain medicine.    DO NOT TAKE MORE THAN 4000 MG OF TYLENOL PER DAY.  IT CAN HARM YOUR LIVER.  TYLENOL (ACETAMINOPHEN) IS ALSO IN YOUR PRESCRIPTION PAIN MEDICATION.  YOU HAVE TO COUNT IT IN YOUR DAILY TOTAL. Patient taking differently: You can take 2 tablets every 4-6 hours as needed for pain.  You can alternate with plain Tylenol or Ibuprofen, or the prescribed pain medicine.    DO NOT TAKE MORE THAN 4000 MG OF TYLENOL PER DAY.  IT CAN HARM YOUR LIVER.  TYLENOL (ACETAMINOPHEN) IS ALSO IN YOUR PRESCRIPTION PAIN MEDICATION.  YOU HAVE TO COUNT IT IN YOUR DAILY TOTAL. 04/26/17   Sherrie George, PA-C  ALPRAZolam Prudy Feeler) 0.5 MG tablet Take 1-2 tablets by mouth as needed  for flying. 09/24/22     Vedolizumab (ENTYVIO IV) Inject into the vein every 8 (eight) weeks.    [provider]    Current Outpatient Medications  Medication Sig Dispense Refill   amphetamine-dextroamphetamine (ADDERALL) 20 MG tablet Take 1 tablet (20 mg) by mouth daily in the afternoon.  07/27/22 30 tablet 0   Cholecalciferol (VITAMIN D3) 1000 units CAPS Take 2 capsules by mouth daily. (2,000 IU total) 60 capsule 0   escitalopram (LEXAPRO) 20 MG tablet Take 20 mg by mouth daily.  5   famotidine (PEPCID) 20 MG tablet Take 1 tablet (20 mg total) by mouth as needed for heartburn or indigestion.     lisdexamfetamine (VYVANSE) 60 MG capsule Take 1 capsule (60 mg total) by mouth daily. 30 capsule 0   metoprolol succinate (TOPROL-XL) 25 MG 24 hr tablet Take 12.5 mg by mouth daily.     REXULTI 2 MG TABS tablet Take 2 mg by mouth at bedtime.     acetaminophen (TYLENOL)  325 MG tablet You can take 2 tablets every 4-6 hours as needed for pain.  You can alternate with plain Tylenol or Ibuprofen, or the prescribed pain medicine.    DO NOT TAKE MORE THAN 4000 MG OF TYLENOL PER DAY.  IT CAN HARM YOUR LIVER.  TYLENOL (ACETAMINOPHEN) IS ALSO IN YOUR PRESCRIPTION PAIN MEDICATION.  YOU HAVE TO COUNT IT IN YOUR DAILY TOTAL. (Patient taking differently: You can take 2 tablets every 4-6 hours as needed for pain.  You can alternate with plain Tylenol or Ibuprofen, or the prescribed pain medicine.    DO NOT TAKE MORE THAN 4000 MG OF TYLENOL PER DAY.  IT CAN HARM YOUR LIVER.  TYLENOL (ACETAMINOPHEN) IS ALSO IN YOUR PRESCRIPTION PAIN MEDICATION.  YOU HAVE TO COUNT IT IN YOUR DAILY TOTAL.)     ALPRAZolam (XANAX) 0.5 MG tablet Take 1-2 tablets by mouth as needed  for flying. 10 tablet 0   Vedolizumab (ENTYVIO IV) Inject into the vein every 8 (eight) weeks.     Current Facility-Administered Medications  Medication Dose Route Frequency Provider Last Rate Last Admin   0.9 %  sodium chloride infusion  500 mL Intravenous Once Jaxden Blyden, Willaim Rayas, MD        Allergies as of 11/19/2022   (No Known Allergies)    Family History  Problem Relation Age of Onset   Scoliosis Mother    Osteoporosis Mother    Hypertension Father    Hypertension Maternal Grandmother    Hodgkin's lymphoma Maternal Grandmother    Colon cancer Neg Hx    Stomach cancer Neg Hx    Esophageal cancer Neg Hx    Liver disease Neg Hx    Pancreatic cancer Neg Hx    Colon polyps Neg Hx    Rectal cancer Neg Hx     Social History   Socioeconomic History   Marital status: Single    Spouse name: Not on file   Number of children: Not on file   Years of education: Not on file   Highest education level: Not on file  Occupational History   Not on file  Tobacco Use   Smoking status: Never   Smokeless tobacco: Never  Vaping Use   Vaping status: Never Used  Substance and Sexual Activity   Alcohol use: Yes     Comment: occ   Drug use: No   Sexual activity: Never    Partners: Female    Birth control/protection: None  Other Topics Concern   Not on file  Social History Narrative   Teacher - Administrator   Social Determinants of Health   Financial Resource Strain: Not on file  Food Insecurity: Not on file  Transportation Needs: Not on file  Physical Activity: Not on file  Stress: Not on file  Social Connections: Unknown (07/21/2021)   Received from [facility]   Social Network    Social Network: Not on file  Intimate Partner Violence: Unknown (06/12/2021)   [facility], [facility]   HITS    Physically Hurt: Not on file    Insult or Talk Down To: Not on file    Threaten Physical Harm: Not on file    Scream or Curse: Not on file    Review of Systems: All other review of systems negative except as mentioned in the HPI.  Physical Exam: Vital signs BP 107/65   Pulse 91   Temp 98 F (36.7 C)   Ht 5\' 5"  (1.651 m)   Wt 197 lb (89.4 kg)   LMP  (LMP Unknown)   SpO2 98%   BMI 32.78 kg/m   General:   Alert,  Well-developed, pleasant and cooperative in NAD Lungs:  Clear throughout to auscultation.   Heart:  Regular rate and rhythm Abdomen:  Soft, nontender and nondistended.   Neuro/Psych:  Alert and cooperative. Normal mood and affect. A and O x 3  Harlin Rain, MD [facility]

## 2022-11-19 NOTE — Progress Notes (Signed)
Sedate, gd SR, tolerated procedure well, VSS, report to RN 

## 2022-11-19 NOTE — Patient Instructions (Signed)
Resume all of your previous medications today as ordered.  Read all of the instructions and handouts given to you by your recovery room nurse.  YOU HAD AN ENDOSCOPIC PROCEDURE TODAY AT THE [facility]:   Refer to the procedure report that was given to you for any specific questions about what was found during the examination.  If the procedure report does not answer your questions, please call your gastroenterologist to clarify.  If you requested that your care partner not be given the details of your procedure findings, then the procedure report has been included in a sealed envelope for you to review at your convenience later.  YOU SHOULD EXPECT: Some feelings of bloating in the abdomen. Passage of more gas than usual.  Walking can help get rid of the air that was put into your GI tract during the procedure and reduce the bloating. If you had a lower endoscopy (such as a colonoscopy or flexible sigmoidoscopy) you may notice spotting of blood in your stool or on the toilet paper. If you underwent a bowel prep for your procedure, you may not have a normal bowel movement for a few days.  Please Note:  You might notice some irritation and congestion in your nose or some drainage.  This is from the oxygen used during your procedure.  There is no need for concern and it should clear up in a day or so.  SYMPTOMS TO REPORT IMMEDIATELY:  Following lower endoscopy (colonoscopy or flexible sigmoidoscopy):  Excessive amounts of blood in the stool  Significant tenderness or worsening of abdominal pains  Swelling of the abdomen that is new, acute  Fever of 100F or higher   For urgent or emergent issues, a gastroenterologist can be reached at any hour by calling [phone]. Do not use MyChart messaging for urgent concerns.    DIET:  We do recommend a small meal at first, but then you may proceed to your regular diet.  Drink plenty of fluids but you should avoid alcoholic beverages for 24  hours.  ACTIVITY:  You should plan to take it easy for the rest of today and you should NOT DRIVE or use heavy machinery until tomorrow (because of the sedation medicines used during the test).    FOLLOW UP: Our staff will call the number listed on your records the next business day following your procedure.  We will call around 7:15- 8:00 am to check on you and address any questions or concerns that you may have regarding the information given to you following your procedure. If we do not reach you, we will leave a message.     If any biopsies were taken you will be contacted by phone or by letter within the next 1-3 weeks.  Please call us at [phone] if you have not heard about the biopsies in 3 weeks.    SIGNATURES/CONFIDENTIALITY: You and/or your care partner have signed paperwork which will be entered into your electronic medical record.  These signatures attest to the fact that that the information above on your After Visit Summary has been reviewed and is understood.  Full responsibility of the confidentiality of this discharge information lies with you and/or your care-partner.

## 2022-11-19 NOTE — Progress Notes (Signed)
Called to room to assist during endoscopic procedure.  Patient ID and intended procedure confirmed with present staff. Received instructions for my participation in the procedure from the performing physician.  

## 2022-11-19 NOTE — Progress Notes (Signed)
VS by [place]  Pt's states no medical or surgical changes since previsit or office visit.  

## 2022-11-19 NOTE — Op Note (Signed)
[facility] Patient Name: Samantha Barry Procedure Date: 11/19/2022 2:32 PM MRN: [id number] Endoscopist: Samantha Barry , MD, [id number] Age: 57 Referring MD:  Date of Birth: 08/31/1965 Gender: Female Account #: [id number] Procedure:                Colonoscopy Indications:              Disease activity assessment of chronic ulcerative                            pancolitis - on Entyvio since end of 2022 with good                            control of symptoms. Medicines:                Monitored Anesthesia Care Procedure:                Pre-Anesthesia Assessment:                           - Prior to the procedure, a History and Physical                            was performed, and patient medications and                            allergies were reviewed. The patient's tolerance of                            previous anesthesia was also reviewed. The risks                            and benefits of the procedure and the sedation                            options and risks were discussed with the patient.                            All questions were answered, and informed consent                            was obtained. Prior Anticoagulants: The patient has                            taken no anticoagulant or antiplatelet agents. ASA                            Grade Assessment: II - A patient with mild systemic                            disease. After reviewing the risks and benefits,                            the patient was deemed in satisfactory condition to  undergo the procedure.                           After obtaining informed consent, the colonoscope                            was passed under direct vision. Throughout the                            procedure, the patient's blood pressure, pulse, and                            oxygen saturations were monitored continuously. The                            Olympus Scope [id number] was  introduced through the                            anus and advanced to the the cecum, identified by                            appendiceal orifice and ileocecal valve. The                            colonoscopy was performed without difficulty. The                            patient tolerated the procedure well. The quality                            of the bowel preparation was good. The ileocecal                            valve, appendiceal orifice, and rectum were                            photographed. Scope In: 2:54:59 PM Scope Out: 3:13:35 PM Scope Withdrawal Time: 0 hours 14 minutes 22 seconds  Total Procedure Duration: 0 hours 18 minutes 36 seconds  Findings:                 The perianal and digital rectal examinations were                            normal.                           A 5 to 6 mm polyp was found in the transverse                            colon. The polyp was flat. The polyp was removed                            with a cold snare. Resection and retrieval were  complete.                           Internal hemorrhoids were found during retroflexion.                           The exam was otherwise without abnormality. No                            active overt inflammation.                           Biopsies were taken with a cold forceps for                            histology for surveillance from the right, left,                            transverse colon, and rectum. Complications:            No immediate complications. Estimated blood loss:                            Minimal. Estimated Blood Loss:     Estimated blood loss was minimal. Impression:               - One 5 to 6 mm polyp in the transverse colon,                            removed with a cold snare. Resected and retrieved.                           - Internal hemorrhoids.                           - The examination was otherwise normal.                           -  Biopsies were taken with a cold forceps for                            histology for surveillance.                           Overall, an excellent response to Entyvio. Recommendation:           - Patient has a contact number available for                            emergencies. The signs and symptoms of potential                            delayed complications were discussed with the                            patient. Return to normal activities tomorrow.  Written discharge instructions were provided to the                            patient.                           - Resume previous diet.                           - Continue present medications.                           - Await pathology results. Samantha Spare P. Nguyen Butler, MD 11/19/2022 3:18:54 PM This report has been signed electronically.

## 2022-11-22 ENCOUNTER — Telehealth: Payer: Self-pay

## 2022-11-22 NOTE — Telephone Encounter (Signed)
  Follow up Call-     11/19/2022    2:26 PM 11/12/2020    2:54 PM  Call back number  Post procedure Call Back phone  # [phone]  Permission to leave phone message Yes Yes     Patient questions:  Do you have a fever, pain , or abdominal swelling? No. Pain Score  0 *  Have you tolerated food without any problems? Yes.    Have you been able to return to your normal activities? Yes.    Do you have any questions about your discharge instructions: Diet   No. Medications  No. Follow up visit  No.  Do you have questions or concerns about your Care? No.  Actions: * If pain score is 4 or above: No action needed, pain <4.

## 2022-11-25 LAB — SURGICAL PATHOLOGY

## 2022-12-08 ENCOUNTER — Other Ambulatory Visit (HOSPITAL_COMMUNITY): Payer: Self-pay

## 2022-12-08 ENCOUNTER — Other Ambulatory Visit: Payer: Self-pay

## 2022-12-08 MED ORDER — REXULTI 2 MG PO TABS
2.0000 mg | ORAL_TABLET | Freq: Every day | ORAL | 1 refills | Status: AC
  Filled 2022-12-08: qty 90, 90d supply, fill #0
  Filled 2023-08-05: qty 30, 30d supply, fill #0
  Filled 2023-10-04: qty 30, 30d supply, fill #1

## 2022-12-08 MED ORDER — AMPHETAMINE-DEXTROAMPHETAMINE 20 MG PO TABS
20.0000 mg | ORAL_TABLET | Freq: Every day | ORAL | 0 refills | Status: AC
Start: 2022-12-08 — End: ?
  Filled 2022-12-08: qty 30, 30d supply, fill #0

## 2022-12-08 MED ORDER — ESCITALOPRAM OXALATE 20 MG PO TABS
20.0000 mg | ORAL_TABLET | Freq: Every day | ORAL | 1 refills | Status: AC
  Filled 2022-12-08 – 2023-03-07 (×2): qty 90, 90d supply, fill #0
  Filled 2023-09-02 – 2023-11-29 (×2): qty 90, 90d supply, fill #1

## 2022-12-08 MED ORDER — AMPHETAMINE-DEXTROAMPHETAMINE 20 MG PO TABS
20.0000 mg | ORAL_TABLET | Freq: Every day | ORAL | 0 refills | Status: DC
  Filled 2023-02-15: qty 30, 30d supply, fill #0

## 2022-12-08 MED ORDER — AMPHETAMINE-DEXTROAMPHETAMINE 20 MG PO TABS
20.0000 mg | ORAL_TABLET | Freq: Every day | ORAL | 0 refills | Status: AC
  Filled 2023-01-12: qty 30, 30d supply, fill #0

## 2022-12-09 ENCOUNTER — Other Ambulatory Visit (HOSPITAL_COMMUNITY): Payer: Self-pay

## 2022-12-22 ENCOUNTER — Encounter: Payer: Self-pay | Admitting: Gastroenterology

## 2022-12-28 ENCOUNTER — Ambulatory Visit: Payer: BC Managed Care – PPO

## 2022-12-28 VITALS — BP 108/74 | HR 87 | Temp 98.2°F | Resp 18 | Ht 65.0 in | Wt 201.0 lb

## 2022-12-28 DIAGNOSIS — K5100 Ulcerative (chronic) pancolitis without complications: Secondary | ICD-10-CM

## 2022-12-28 MED ORDER — VEDOLIZUMAB 300 MG IV SOLR
300.0000 mg | Freq: Once | INTRAVENOUS | Status: AC
  Administered 2022-12-28: 300 mg via INTRAVENOUS
  Filled 2022-12-28: qty 5

## 2022-12-28 NOTE — Progress Notes (Signed)
Diagnosis: Ulcerative Colitis  Provider:  Chilton Greathouse MD  Procedure: IV Infusion  IV Type: Peripheral, IV Location: L Forearm  Entyvio (Vedolizumab), Dose: 300 mg  Infusion Start Time: 1403  Infusion Stop Time: 1439  Post Infusion IV Care: Peripheral IV Discontinued  Discharge: Condition: Good, Destination: Home . AVS Declined  Performed by:  Adriana Mccallum, RN

## 2023-01-12 ENCOUNTER — Other Ambulatory Visit (HOSPITAL_COMMUNITY): Payer: Self-pay

## 2023-01-14 ENCOUNTER — Other Ambulatory Visit (HOSPITAL_COMMUNITY): Payer: Self-pay

## 2023-02-15 ENCOUNTER — Other Ambulatory Visit (HOSPITAL_COMMUNITY): Payer: Self-pay

## 2023-02-16 ENCOUNTER — Other Ambulatory Visit (HOSPITAL_COMMUNITY): Payer: Self-pay

## 2023-02-22 ENCOUNTER — Ambulatory Visit: Payer: BC Managed Care – PPO

## 2023-02-22 VITALS — BP 108/74 | HR 84 | Temp 97.9°F | Resp 16 | Ht 65.0 in | Wt 202.2 lb

## 2023-02-22 DIAGNOSIS — K5100 Ulcerative (chronic) pancolitis without complications: Secondary | ICD-10-CM | POA: Diagnosis not present

## 2023-02-22 MED ORDER — VEDOLIZUMAB 300 MG IV SOLR
300.0000 mg | Freq: Once | INTRAVENOUS | Status: AC
  Administered 2023-02-22: 300 mg via INTRAVENOUS
  Filled 2023-02-22: qty 5

## 2023-02-22 NOTE — Progress Notes (Signed)
Diagnosis: Ulcerative Colitis  Provider:  Chilton Greathouse MD  Procedure: IV Infusion  IV Type: Peripheral, IV Location: L Antecubital  Entyvio (Vedolizumab), Dose: 300 mg  Infusion Start Time: 1420 pm  Infusion Stop Time: 1500 pm  Post Infusion IV Care: Observation period completed and Peripheral IV Discontinued  Discharge: Condition: Good, Destination: Home . AVS Declined  Performed by:  Forrest Moron, RN

## 2023-03-07 ENCOUNTER — Encounter: Payer: Self-pay | Admitting: Gastroenterology

## 2023-03-07 ENCOUNTER — Other Ambulatory Visit (HOSPITAL_COMMUNITY): Payer: Self-pay

## 2023-04-19 ENCOUNTER — Ambulatory Visit: Payer: 59

## 2023-04-19 ENCOUNTER — Encounter: Payer: Self-pay | Admitting: Gastroenterology

## 2023-04-21 ENCOUNTER — Other Ambulatory Visit (HOSPITAL_COMMUNITY): Payer: Self-pay

## 2023-04-21 MED ORDER — AMPHETAMINE-DEXTROAMPHETAMINE 20 MG PO TABS
20.0000 mg | ORAL_TABLET | Freq: Every day | ORAL | 0 refills | Status: DC
  Filled 2023-04-21: qty 30, 30d supply, fill #0

## 2023-04-25 ENCOUNTER — Ambulatory Visit: Payer: 59

## 2023-04-26 ENCOUNTER — Ambulatory Visit: Payer: 59

## 2023-04-26 ENCOUNTER — Telehealth: Payer: Self-pay | Admitting: Pharmacy Technician

## 2023-04-26 VITALS — BP 107/70 | HR 86 | Temp 98.1°F | Resp 18 | Ht 65.0 in | Wt 205.0 lb

## 2023-04-26 DIAGNOSIS — K5100 Ulcerative (chronic) pancolitis without complications: Secondary | ICD-10-CM

## 2023-04-26 MED ORDER — VEDOLIZUMAB 300 MG IV SOLR
300.0000 mg | Freq: Once | INTRAVENOUS | Status: AC
  Administered 2023-04-26: 300 mg via INTRAVENOUS
  Filled 2023-04-26: qty 5

## 2023-04-26 NOTE — Telephone Encounter (Signed)
Auth Submission: APPROVED Site of care: Site of care: [facility] Payer: [employer] Medication & CPT/J Code(s) submitted: Entyvio (Vedolizumab) [id number] Route of submission (phone, fax, portal): PORTAL Phone # Fax # Auth type: Buy/Bill PB Units/visits requested: 300MG  Q8WKS Reference number: [id number] Approval from: 04/20/23 to 04/19/24

## 2023-04-26 NOTE — Progress Notes (Signed)
Diagnosis: Chronic ulcerative enterocolitis without complication   Provider:  Chilton Greathouse MD  Procedure: IV Infusion  IV Type: Peripheral, IV Location: L Forearm  Entyvio (Vedolizumab), Dose: 300 mg  Infusion Start Time: 1541  Infusion Stop Time: 1618  Post Infusion IV Care: Peripheral IV Discontinued  Discharge: Condition: Good, Destination: Home . AVS Declined  Performed by:  Rico Ala, LPN

## 2023-05-05 ENCOUNTER — Encounter: Payer: Self-pay | Admitting: Gastroenterology

## 2023-05-25 ENCOUNTER — Other Ambulatory Visit (HOSPITAL_COMMUNITY): Payer: Self-pay

## 2023-05-25 MED ORDER — AMPHETAMINE-DEXTROAMPHETAMINE 20 MG PO TABS
20.0000 mg | ORAL_TABLET | Freq: Every day | ORAL | 0 refills | Status: DC
  Filled 2023-05-25: qty 30, 30d supply, fill #0

## 2023-06-07 ENCOUNTER — Other Ambulatory Visit (HOSPITAL_COMMUNITY): Payer: Self-pay

## 2023-06-07 MED ORDER — LISDEXAMFETAMINE DIMESYLATE 60 MG PO CAPS
60.0000 mg | ORAL_CAPSULE | Freq: Every day | ORAL | 0 refills | Status: DC
  Filled 2023-06-07 – 2023-06-14 (×2): qty 30, 30d supply, fill #0

## 2023-06-07 MED ORDER — REXULTI 2 MG PO TABS
2.0000 mg | ORAL_TABLET | Freq: Every day | ORAL | 1 refills | Status: AC
  Filled 2023-06-07: qty 90, 90d supply, fill #0
  Filled 2023-07-10: qty 30, 30d supply, fill #0
  Filled 2023-08-08 – 2023-09-05 (×2): qty 30, 30d supply, fill #1
  Filled 2023-09-05: qty 90, 90d supply, fill #1
  Filled 2023-12-29: qty 30, 30d supply, fill #2
  Filled 2024-01-25: qty 30, 30d supply, fill #3

## 2023-06-07 MED ORDER — AMPHETAMINE-DEXTROAMPHETAMINE 20 MG PO TABS
20.0000 mg | ORAL_TABLET | Freq: Every day | ORAL | 0 refills | Status: DC
Start: 2023-06-07 — End: 2023-08-02
  Filled 2023-06-07 – 2023-06-28 (×3): qty 30, 30d supply, fill #0

## 2023-06-07 MED ORDER — ESCITALOPRAM OXALATE 20 MG PO TABS
20.0000 mg | ORAL_TABLET | Freq: Every day | ORAL | 1 refills | Status: AC
  Filled 2023-06-07: qty 90, 90d supply, fill #0
  Filled 2023-09-02: qty 90, 90d supply, fill #1

## 2023-06-14 ENCOUNTER — Other Ambulatory Visit (HOSPITAL_COMMUNITY): Payer: Self-pay

## 2023-06-21 ENCOUNTER — Ambulatory Visit: Payer: 59

## 2023-06-21 VITALS — BP 106/71 | HR 84 | Temp 98.5°F | Resp 18 | Ht 65.0 in | Wt 192.4 lb

## 2023-06-21 DIAGNOSIS — K5100 Ulcerative (chronic) pancolitis without complications: Secondary | ICD-10-CM | POA: Diagnosis not present

## 2023-06-21 MED ORDER — VEDOLIZUMAB 300 MG IV SOLR
300.0000 mg | Freq: Once | INTRAVENOUS | Status: AC
  Administered 2023-06-21: 300 mg via INTRAVENOUS
  Filled 2023-06-21: qty 5

## 2023-06-21 NOTE — Progress Notes (Cosign Needed Addendum)
 Diagnosis: Ulcerative Colitis  Provider:  Phyllis Breeze MD  Procedure: IV Infusion  IV Type: Peripheral, IV Location: L Antecubital  Entyvio (Vedolizumab), Dose: 300 mg  Infusion Start Time: 1412  Infusion Stop Time: 1446  Post Infusion IV Care: Peripheral IV Discontinued  Discharge: Condition: Good, Destination: Home . AVS Declined  Performed by:  Marilyne Shu, E RN

## 2023-06-28 ENCOUNTER — Other Ambulatory Visit: Payer: Self-pay

## 2023-07-10 ENCOUNTER — Other Ambulatory Visit: Payer: Self-pay

## 2023-07-11 ENCOUNTER — Other Ambulatory Visit (HOSPITAL_COMMUNITY): Payer: Self-pay

## 2023-07-13 ENCOUNTER — Other Ambulatory Visit (HOSPITAL_COMMUNITY): Payer: Self-pay

## 2023-07-13 MED ORDER — LISDEXAMFETAMINE DIMESYLATE 60 MG PO CAPS
60.0000 mg | ORAL_CAPSULE | Freq: Every day | ORAL | 0 refills | Status: DC
  Filled 2023-07-13: qty 30, 30d supply, fill #0

## 2023-08-02 ENCOUNTER — Other Ambulatory Visit (HOSPITAL_COMMUNITY): Payer: Self-pay

## 2023-08-02 MED ORDER — AMPHETAMINE-DEXTROAMPHETAMINE 20 MG PO TABS
20.0000 mg | ORAL_TABLET | Freq: Every day | ORAL | 0 refills | Status: DC
  Filled 2023-08-02: qty 30, 30d supply, fill #0

## 2023-08-05 ENCOUNTER — Other Ambulatory Visit: Payer: Self-pay

## 2023-08-05 ENCOUNTER — Other Ambulatory Visit (HOSPITAL_COMMUNITY): Payer: Self-pay

## 2023-08-08 ENCOUNTER — Other Ambulatory Visit (HOSPITAL_COMMUNITY): Payer: Self-pay

## 2023-08-13 ENCOUNTER — Other Ambulatory Visit (HOSPITAL_COMMUNITY): Payer: Self-pay

## 2023-08-13 MED ORDER — LISDEXAMFETAMINE DIMESYLATE 60 MG PO CAPS
60.0000 mg | ORAL_CAPSULE | Freq: Every day | ORAL | 0 refills | Status: DC
  Filled 2023-08-13: qty 30, 30d supply, fill #0

## 2023-08-16 ENCOUNTER — Ambulatory Visit

## 2023-08-16 ENCOUNTER — Encounter: Payer: Self-pay | Admitting: Gastroenterology

## 2023-08-16 VITALS — BP 104/70 | HR 84 | Temp 97.9°F | Resp 12 | Ht 65.0 in | Wt 183.6 lb

## 2023-08-16 DIAGNOSIS — K5100 Ulcerative (chronic) pancolitis without complications: Secondary | ICD-10-CM

## 2023-08-16 MED ORDER — VEDOLIZUMAB 300 MG IV SOLR
300.0000 mg | Freq: Once | INTRAVENOUS | Status: AC
  Administered 2023-08-16: 300 mg via INTRAVENOUS
  Filled 2023-08-16: qty 5

## 2023-08-16 NOTE — Progress Notes (Signed)
 Diagnosis: Ulcerative Colitis  Provider:  Mannam, Praveen MD  Procedure: IV Infusion  IV Type: Peripheral, IV Location: L Antecubital  Entyvio  (Vedolizumab ), Dose: 300 mg  Infusion Start Time: 1424  Infusion Stop Time: 1456 pm  Post Infusion IV Care: Observation period completed and Peripheral IV Discontinued  Discharge: Condition: Good, Destination: Home . AVS Declined  Performed by:  Lendel Quant, RN

## 2023-09-02 ENCOUNTER — Other Ambulatory Visit: Payer: Self-pay

## 2023-09-02 ENCOUNTER — Other Ambulatory Visit (HOSPITAL_COMMUNITY): Payer: Self-pay

## 2023-09-03 ENCOUNTER — Other Ambulatory Visit (HOSPITAL_COMMUNITY): Payer: Self-pay

## 2023-09-03 MED ORDER — AMPHETAMINE-DEXTROAMPHETAMINE 20 MG PO TABS
20.0000 mg | ORAL_TABLET | Freq: Every day | ORAL | 0 refills | Status: DC
  Filled 2023-09-03: qty 30, 30d supply, fill #0

## 2023-09-05 ENCOUNTER — Other Ambulatory Visit (HOSPITAL_COMMUNITY): Payer: Self-pay

## 2023-09-12 ENCOUNTER — Ambulatory Visit (INDEPENDENT_AMBULATORY_CARE_PROVIDER_SITE_OTHER): Payer: 59 | Admitting: Obstetrics and Gynecology

## 2023-09-12 ENCOUNTER — Encounter: Payer: Self-pay | Admitting: Obstetrics and Gynecology

## 2023-09-12 VITALS — BP 112/74 | HR 108 | Ht 66.0 in | Wt 180.0 lb

## 2023-09-12 DIAGNOSIS — Z1331 Encounter for screening for depression: Secondary | ICD-10-CM

## 2023-09-12 DIAGNOSIS — Z8719 Personal history of other diseases of the digestive system: Secondary | ICD-10-CM

## 2023-09-12 DIAGNOSIS — Z13820 Encounter for screening for osteoporosis: Secondary | ICD-10-CM

## 2023-09-12 DIAGNOSIS — Z01419 Encounter for gynecological examination (general) (routine) without abnormal findings: Secondary | ICD-10-CM | POA: Diagnosis not present

## 2023-09-12 NOTE — Patient Instructions (Addendum)
 Phone number for scheduling a bone density at [facility], [phone].    EXERCISE AND DIET:  We recommended that you start or continue a regular exercise program for good health. Regular exercise means any activity that makes your heart beat faster and makes you sweat.  We recommend exercising at least 30 minutes per day at least 3 days a week, preferably 4 or 5.  We also recommend a diet low in fat and sugar.  Inactivity, poor dietary choices and obesity can cause diabetes, heart attack, stroke, and kidney damage, among others.    ALCOHOL AND SMOKING:  Women should limit their alcohol intake to no more than 7 drinks/beers/glasses of wine (combined, not each!) per week. Moderation of alcohol intake to this level decreases your risk of breast cancer and liver damage. And of course, no recreational drugs are part of a healthy lifestyle.  And absolutely no smoking or even second hand smoke. Most people know smoking can cause heart and lung diseases, but did you know it also contributes to weakening of your bones? Aging of your skin?  Yellowing of your teeth and nails?  CALCIUM AND VITAMIN D :  Adequate intake of calcium and Vitamin D  are recommended.  The recommendations for exact amounts of these supplements seem to change often, but generally speaking 600 mg of calcium (either carbonate or citrate) and 800 units of Vitamin D  per day seems prudent. Certain women may benefit from higher intake of Vitamin D .  If you are among these women, your doctor will have told you during your visit.    PAP SMEARS:  Pap smears, to check for cervical cancer or precancers,  have traditionally been done yearly, although recent scientific advances have shown that most women can have pap smears less often.  However, every woman still should have a physical exam from her gynecologist every year. It will include a breast check, inspection of the vulva and vagina to check for abnormal growths or skin changes, a visual exam of the  cervix, and then an exam to evaluate the size and shape of the uterus and ovaries.  And after 58 years of age, a rectal exam is indicated to check for rectal cancers. We will also provide age appropriate advice regarding health maintenance, like when you should have certain vaccines, screening for sexually transmitted diseases, bone density testing, colonoscopy, mammograms, etc.   MAMMOGRAMS:  All women over 46 years old should have a yearly mammogram. Many facilities now offer a 3D mammogram, which may cost around $50 extra out of pocket. If possible,  we recommend you accept the option to have the 3D mammogram performed.  It both reduces the number of women who will be called back for extra views which then turn out to be normal, and it is better than the routine mammogram at detecting truly abnormal areas.    COLONOSCOPY:  Colonoscopy to screen for colon cancer is recommended for all women at age 60.  We know, you hate the idea of the prep.  We agree, BUT, having colon cancer and not knowing it is worse!!  Colon cancer so often starts as a polyp that can be seen and removed at colonscopy, which can quite literally save your life!  And if your first colonoscopy is normal and you have no family history of colon cancer, most women don't have to have it again for 10 years.  Once every ten years, you can do something that may end up saving your life, right?  We  will be happy to help you get it scheduled when you are ready.  Be sure to check your insurance coverage so you understand how much it will cost.  It may be covered as a preventative service at no cost, but you should check your particular policy.

## 2023-09-12 NOTE — Progress Notes (Signed)
 57 y.o. G0P0000 Single Caucasian female here for annual exam.    LMP stopped at age 77 or 58 yo.  No HRT.  No bleeding.   Lost 11 pounds, less carbs and sugar, and working out more to bring her A1C down into prediabetes.   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  Lives with housemate and 3 rescue dogs.  Working out at [employer].   PCP: Rolinda Millman, MD  Cardiology:  every 3 months for tachycardia.   No LMP recorded (lmp unknown). Patient is postmenopausal.           Sexually active: No.  The current method of family planning is none.    Menopausal hormone therapy:  n/a Exercising: Yes.    Swim class 3x week  Smoker:  no  OB History  Gravida Para Term Preterm AB Living  0 0 0 0 0 0  SAB IAB Ectopic Multiple Live Births  0 0 0 0 0     HEALTH MAINTENANCE: Last 2 paps:  01/13/21 neg HR HPV neg History of abnormal Pap or positive HPV:  no Mammogram:   05/03/23 [facility] Cat 2 benign (Care Everywhere) Colonoscopy:  11/19/22 - due in about 2029.  Dr. Leigh.   Bone Density:  n/a  Result  n/a   Immunization History  Administered Date(s) Administered   Influenza,inj,Quad PF,6+ Mos 03/17/2016   Tdap 03/10/2016      reports that she has never smoked. She has never used smokeless tobacco. She reports current alcohol use. She reports that she does not use drugs.  Past Medical History:  Diagnosis Date   ADHD    Depression    GERD (gastroesophageal reflux disease)    Osteoporosis    Prediabetes 01/14/2021   Ulcerative colitis (HCC)    Urinary incontinence    Vitamin D  deficiency 01/14/2021    Past Surgical History:  Procedure Laterality Date   APPENDECTOMY     CHOLECYSTECTOMY     COLONOSCOPY     LAPAROSCOPIC APPENDECTOMY N/A 04/25/2017   Procedure: APPENDECTOMY LAPAROSCOPIC;  Surgeon: Tanda Locus, MD;  Location: [facility];  Service: General;  Laterality: N/A;   MOUTH SURGERY      Current Outpatient Medications  Medication Sig Dispense Refill   acetaminophen  (TYLENOL ) 325 MG  tablet You can take 2 tablets every 4-6 hours as needed for pain.  You can alternate with plain Tylenol  or Ibuprofen , or the prescribed pain medicine.    DO NOT TAKE MORE THAN 4000 MG OF TYLENOL  PER DAY.  IT CAN HARM YOUR LIVER.  TYLENOL  (ACETAMINOPHEN ) IS ALSO IN YOUR PRESCRIPTION PAIN MEDICATION.  YOU HAVE TO COUNT IT IN YOUR DAILY TOTAL. (Patient taking differently: You can take 2 tablets every 4-6 hours as needed for pain.  You can alternate with plain Tylenol  or Ibuprofen , or the prescribed pain medicine.    DO NOT TAKE MORE THAN 4000 MG OF TYLENOL  PER DAY.  IT CAN HARM YOUR LIVER.  TYLENOL  (ACETAMINOPHEN ) IS ALSO IN YOUR PRESCRIPTION PAIN MEDICATION.  YOU HAVE TO COUNT IT IN YOUR DAILY TOTAL.)     ALPRAZolam  (XANAX ) 0.5 MG tablet Take 1-2 tablets by mouth as needed  for flying. 10 tablet 0   amphetamine -dextroamphetamine  (ADDERALL) 20 MG tablet Take 1 tablet (20 mg) by mouth daily in the afternoon. 07/27/22 30 tablet 0   amphetamine -dextroamphetamine  (ADDERALL) 20 MG tablet Take 1 tablet (20 mg total) by mouth daily in the afternoon. 30 tablet 0   amphetamine -dextroamphetamine  (ADDERALL) 20 MG tablet Take  1 tablet (20 mg total) by mouth daily in the afternoon. 30 tablet 0   amphetamine -dextroamphetamine  (ADDERALL) 20 MG tablet Take 1 tablet (20 mg total) by mouth daily in the afternoon. 30 tablet 0   Blood Glucose Monitoring Suppl (ONETOUCH VERIO FLEX SYSTEM) w/Device KIT for use when checking sugars daily; alternating fasting AM and 2 hours after a meal     brexpiprazole  (REXULTI ) 2 MG TABS tablet Take 1 tablet by mouth daily at bedtime 90 tablet 1   brexpiprazole  (REXULTI ) 2 MG TABS tablet Take 1 tablet (2 mg total) by mouth at bedtime. 90 tablet 1   calcium carbonate (SUPER CALCIUM) 1500 (600 Ca) MG TABS tablet 1 tablet with meals Orally once a day     Cholecalciferol (VITAMIN D3) 1000 units CAPS Take 2 capsules by mouth daily. (2,000 IU total) 60 capsule 0   Cyanocobalamin (VITAMIN B12 PO)  Take by mouth.     escitalopram  (LEXAPRO ) 20 MG tablet Take 20 mg by mouth daily.  5   escitalopram  (LEXAPRO ) 20 MG tablet Take 1 tablet (20 mg total) by mouth daily. 90 tablet 1   escitalopram  (LEXAPRO ) 20 MG tablet Take 1 tablet (20 mg total) by mouth daily. 90 tablet 1   glucose blood (ONETOUCH VERIO) test strip for use when checking sugars daily; alternating fasting AM and 2 hours after a meal for 90 days     Lancets (ONETOUCH DELICA PLUS LANCET33G) MISC SMARTSIG:1 Topical Daily     lisdexamfetamine (VYVANSE ) 60 MG capsule Take 1 capsule (60 mg total) by mouth daily. 30 capsule 0   lisdexamfetamine (VYVANSE ) 60 MG capsule Take 1 capsule (60 mg total) by mouth daily. 30 capsule 0   lisinopril (ZESTRIL) 2.5 MG tablet Take 2.5 mg by mouth daily.     metoprolol succinate (TOPROL-XL) 25 MG 24 hr tablet Take 12.5 mg by mouth daily.     REXULTI  2 MG TABS tablet Take 2 mg by mouth at bedtime.     rosuvastatin (CRESTOR) 10 MG tablet Take 10 mg by mouth once a week.     Vedolizumab  (ENTYVIO  IV) Inject into the vein every 8 (eight) weeks.     famotidine  (PEPCID ) 20 MG tablet Take 1 tablet (20 mg total) by mouth as needed for heartburn or indigestion. (Patient not taking: Reported on 09/12/2023)     Current Facility-Administered Medications  Medication Dose Route Frequency Provider Last Rate Last Admin   0.9 %  sodium chloride  infusion  500 mL Intravenous Once Armbruster, Elspeth SQUIBB, MD        ALLERGIES: Patient has no known allergies.  Family History  Problem Relation Age of Onset   Scoliosis Mother    Osteoporosis Mother    Hypertension Father    Hypertension Maternal Grandmother    Hodgkin's lymphoma Maternal Grandmother    Colon cancer Neg Hx    Stomach cancer Neg Hx    Esophageal cancer Neg Hx    Liver disease Neg Hx    Pancreatic cancer Neg Hx    Colon polyps Neg Hx    Rectal cancer Neg Hx     Review of Systems  All other systems reviewed and are negative.   PHYSICAL EXAM:  BP  112/74 (BP Location: Left Arm, Patient Position: Sitting)   Pulse (!) 108   Ht 5' 6 (1.676 m)   Wt 180 lb (81.6 kg)   LMP  (LMP Unknown)   SpO2 96%   BMI 29.05 kg/m     General  appearance: alert, cooperative and appears stated age Head: normocephalic, without obvious abnormality, atraumatic Neck: no adenopathy, supple, symmetrical, trachea midline and thyroid normal to inspection and palpation Lungs: clear to auscultation bilaterally Breasts: normal appearance, no masses or tenderness, No nipple retraction or dimpling, No nipple discharge or bleeding, No axillary adenopathy Heart: regular rate and rhythm Abdomen: soft, non-tender; no masses, no organomegaly Extremities: extremities normal, atraumatic, no cyanosis or edema Skin: skin color, texture, turgor normal. No rashes or lesions Lymph nodes: cervical, supraclavicular, and axillary nodes normal. Neurologic: grossly normal  Pelvic: External genitalia:  no lesions              No abnormal inguinal nodes palpated.              Urethra:  normal appearing urethra with no masses, tenderness or lesions              Bartholins and Skenes: normal                 Vagina: normal appearing vagina with normal color and discharge, no lesions              Cervix: no lesions              Pap taken: no Bimanual Exam:  Uterus:  normal size, contour, position, consistency, mobility, non-tender              Adnexa: no mass, fullness, tenderness              Rectal exam: yes.  Confirms.              Anus:  normal sphincter tone, no lesions  Chaperone was present for exam:  Kari HERO, CMA  ASSESSMENT: Well woman visit with gynecologic exam. Ulcerative colitis.  Prediabetes.  PHQ-2-9: 0  PLAN: Mammogram screening discussed. Self breast awareness reviewed. Pap and HRV collected:  no.  Due in 2027. Guidelines for Calcium, Vitamin D , regular exercise program including cardiovascular and weight bearing exercise. Medication refills:  NA Bone  density at [facility]. Follow up:  yearly and prn.

## 2023-09-14 ENCOUNTER — Other Ambulatory Visit (HOSPITAL_COMMUNITY): Payer: Self-pay

## 2023-09-14 MED ORDER — LISDEXAMFETAMINE DIMESYLATE 60 MG PO CAPS
60.0000 mg | ORAL_CAPSULE | Freq: Every day | ORAL | 0 refills | Status: DC
  Filled 2023-09-14: qty 30, 30d supply, fill #0

## 2023-10-05 ENCOUNTER — Other Ambulatory Visit (HOSPITAL_COMMUNITY): Payer: Self-pay

## 2023-10-05 MED ORDER — REXULTI 2 MG PO TABS
2.0000 mg | ORAL_TABLET | Freq: Every day | ORAL | 0 refills | Status: DC
  Filled 2023-10-05 – 2023-10-31 (×2): qty 30, 30d supply, fill #0

## 2023-10-05 MED ORDER — AMPHETAMINE-DEXTROAMPHETAMINE 20 MG PO TABS
20.0000 mg | ORAL_TABLET | Freq: Every day | ORAL | 0 refills | Status: AC
  Filled 2023-10-05: qty 30, 30d supply, fill #0

## 2023-10-11 ENCOUNTER — Ambulatory Visit

## 2023-10-11 VITALS — BP 102/70 | HR 82 | Temp 97.9°F | Resp 18 | Ht 65.0 in | Wt 176.4 lb

## 2023-10-11 DIAGNOSIS — K5100 Ulcerative (chronic) pancolitis without complications: Secondary | ICD-10-CM

## 2023-10-11 MED ORDER — VEDOLIZUMAB 300 MG IV SOLR
300.0000 mg | Freq: Once | INTRAVENOUS | Status: AC
  Administered 2023-10-11: 300 mg via INTRAVENOUS
  Filled 2023-10-11: qty 5

## 2023-10-11 NOTE — Progress Notes (Signed)
 Diagnosis: Ulcerative Colitis  Provider:  Lonna Coder MD  Procedure: IV Infusion  IV Type: Peripheral, IV Location: L Forearm  Entyvio  (Vedolizumab ), Dose: 300 mg  Infusion Start Time: 1434  Infusion Stop Time: 1511  Post Infusion IV Care: Peripheral IV Discontinued  Discharge: Condition: Good, Destination: Home . AVS Declined  Performed by:  Leita FORBES Miles, LPN

## 2023-10-13 ENCOUNTER — Other Ambulatory Visit (HOSPITAL_COMMUNITY): Payer: Self-pay

## 2023-10-13 MED ORDER — LISDEXAMFETAMINE DIMESYLATE 60 MG PO CAPS
60.0000 mg | ORAL_CAPSULE | Freq: Every day | ORAL | 0 refills | Status: DC
  Filled 2023-10-13: qty 30, 30d supply, fill #0

## 2023-10-28 ENCOUNTER — Other Ambulatory Visit (HOSPITAL_COMMUNITY): Payer: Self-pay

## 2023-10-31 ENCOUNTER — Other Ambulatory Visit: Payer: Self-pay

## 2023-10-31 ENCOUNTER — Other Ambulatory Visit (HOSPITAL_COMMUNITY): Payer: Self-pay

## 2023-11-01 ENCOUNTER — Other Ambulatory Visit (HOSPITAL_COMMUNITY): Payer: Self-pay

## 2023-11-01 MED ORDER — AMPHETAMINE-DEXTROAMPHETAMINE 20 MG PO TABS
20.0000 mg | ORAL_TABLET | Freq: Every evening | ORAL | 0 refills | Status: DC
  Filled 2023-11-01 – 2023-11-02 (×2): qty 30, 30d supply, fill #0

## 2023-11-02 ENCOUNTER — Other Ambulatory Visit (HOSPITAL_COMMUNITY): Payer: Self-pay

## 2023-11-03 ENCOUNTER — Other Ambulatory Visit (HOSPITAL_COMMUNITY): Payer: Self-pay

## 2023-11-15 ENCOUNTER — Other Ambulatory Visit (HOSPITAL_COMMUNITY): Payer: Self-pay

## 2023-11-15 MED ORDER — LISDEXAMFETAMINE DIMESYLATE 60 MG PO CAPS
60.0000 mg | ORAL_CAPSULE | Freq: Every day | ORAL | 0 refills | Status: DC
  Filled 2023-11-15: qty 30, 30d supply, fill #0

## 2023-11-29 ENCOUNTER — Other Ambulatory Visit (HOSPITAL_COMMUNITY): Payer: Self-pay

## 2023-11-29 ENCOUNTER — Other Ambulatory Visit: Payer: Self-pay

## 2023-11-29 MED ORDER — ESCITALOPRAM OXALATE 20 MG PO TABS
20.0000 mg | ORAL_TABLET | Freq: Every day | ORAL | 1 refills | Status: AC
  Filled 2023-11-29: qty 90, 90d supply, fill #0

## 2023-11-29 MED ORDER — REXULTI 2 MG PO TABS
2.0000 mg | ORAL_TABLET | Freq: Every day | ORAL | 0 refills | Status: AC
  Filled 2023-11-29: qty 30, 30d supply, fill #0

## 2023-11-30 ENCOUNTER — Other Ambulatory Visit (HOSPITAL_COMMUNITY): Payer: Self-pay

## 2023-12-06 ENCOUNTER — Ambulatory Visit (INDEPENDENT_AMBULATORY_CARE_PROVIDER_SITE_OTHER)

## 2023-12-06 VITALS — BP 102/69 | HR 83 | Temp 98.2°F | Resp 16 | Ht 65.0 in | Wt 170.4 lb

## 2023-12-06 DIAGNOSIS — K5100 Ulcerative (chronic) pancolitis without complications: Secondary | ICD-10-CM

## 2023-12-06 MED ORDER — VEDOLIZUMAB 300 MG IV SOLR
300.0000 mg | Freq: Once | INTRAVENOUS | Status: AC
  Administered 2023-12-06: 300 mg via INTRAVENOUS
  Filled 2023-12-06: qty 5

## 2023-12-06 NOTE — Progress Notes (Signed)
 Diagnosis: Ulcerative Colitis  Provider:  Lonna Coder MD  Procedure: IV Infusion  IV Type: Peripheral, IV Location: R Forearm  Entyvio  (Vedolizumab ), Dose: 300 mg  Infusion Start Time: 1416  Infusion Stop Time: 1455  Post Infusion IV Care: Peripheral IV Discontinued  Discharge: Condition: Good, Destination: Home . AVS Declined  Performed by:  Eleanor DELENA Bloch, RN

## 2023-12-10 ENCOUNTER — Other Ambulatory Visit (HOSPITAL_COMMUNITY): Payer: Self-pay

## 2023-12-12 ENCOUNTER — Other Ambulatory Visit (HOSPITAL_COMMUNITY): Payer: Self-pay

## 2023-12-12 MED ORDER — LISDEXAMFETAMINE DIMESYLATE 60 MG PO CAPS
60.0000 mg | ORAL_CAPSULE | Freq: Every day | ORAL | 0 refills | Status: DC
  Filled 2023-12-12: qty 30, 30d supply, fill #0

## 2023-12-14 ENCOUNTER — Other Ambulatory Visit (HOSPITAL_COMMUNITY): Payer: Self-pay

## 2023-12-14 ENCOUNTER — Other Ambulatory Visit: Payer: Self-pay

## 2023-12-14 MED ORDER — AMPHETAMINE-DEXTROAMPHETAMINE 20 MG PO TABS
20.0000 mg | ORAL_TABLET | Freq: Every day | ORAL | 0 refills | Status: DC
  Filled 2023-12-14: qty 30, 30d supply, fill #0

## 2024-01-11 ENCOUNTER — Other Ambulatory Visit (HOSPITAL_COMMUNITY): Payer: Self-pay

## 2024-01-11 MED ORDER — LISDEXAMFETAMINE DIMESYLATE 60 MG PO CAPS
60.0000 mg | ORAL_CAPSULE | Freq: Every day | ORAL | 0 refills | Status: DC
  Filled 2024-01-11: qty 30, 30d supply, fill #0

## 2024-01-11 MED ORDER — AMPHETAMINE-DEXTROAMPHETAMINE 20 MG PO TABS
20.0000 mg | ORAL_TABLET | Freq: Every day | ORAL | 0 refills | Status: DC
  Filled 2024-01-11: qty 30, 30d supply, fill #0

## 2024-01-25 ENCOUNTER — Other Ambulatory Visit: Payer: Self-pay

## 2024-01-25 ENCOUNTER — Other Ambulatory Visit (HOSPITAL_COMMUNITY): Payer: Self-pay

## 2024-01-31 ENCOUNTER — Ambulatory Visit

## 2024-01-31 VITALS — BP 98/64 | HR 78 | Temp 97.9°F | Resp 16 | Ht 65.0 in | Wt 165.0 lb

## 2024-01-31 DIAGNOSIS — K5100 Ulcerative (chronic) pancolitis without complications: Secondary | ICD-10-CM

## 2024-01-31 MED ORDER — VEDOLIZUMAB 300 MG IV SOLR
300.0000 mg | Freq: Once | INTRAVENOUS | Status: AC
  Administered 2024-01-31: 300 mg via INTRAVENOUS
  Filled 2024-01-31: qty 5

## 2024-01-31 NOTE — Progress Notes (Signed)
 Diagnosis: Ulcerative Colitis  Provider:  Lonna Coder MD  Procedure: IV Infusion  IV Type: Peripheral, IV Location: L Forearm  Entyvio  (Vedolizumab ), Dose: 300 mg  Infusion Start Time: 1429  Infusion Stop Time: 1502  Post Infusion IV Care: Peripheral IV Discontinued  Discharge: Condition: Good, Destination: Home . AVS Declined  Performed by:  Maleny Candy, RN

## 2024-02-10 ENCOUNTER — Other Ambulatory Visit (HOSPITAL_COMMUNITY): Payer: Self-pay

## 2024-02-10 MED ORDER — LISDEXAMFETAMINE DIMESYLATE 60 MG PO CAPS
60.0000 mg | ORAL_CAPSULE | Freq: Every day | ORAL | 0 refills | Status: DC
  Filled 2024-02-10: qty 30, 30d supply, fill #0

## 2024-02-10 MED ORDER — AMPHETAMINE-DEXTROAMPHETAMINE 20 MG PO TABS
20.0000 mg | ORAL_TABLET | Freq: Every day | ORAL | 0 refills | Status: DC
  Filled 2024-02-10: qty 30, 30d supply, fill #0

## 2024-02-20 ENCOUNTER — Other Ambulatory Visit (HOSPITAL_COMMUNITY): Payer: Self-pay

## 2024-02-20 ENCOUNTER — Other Ambulatory Visit: Payer: Self-pay

## 2024-02-20 MED ORDER — ESCITALOPRAM OXALATE 20 MG PO TABS
20.0000 mg | ORAL_TABLET | Freq: Every day | ORAL | 1 refills | Status: AC
  Filled 2024-02-20: qty 90, 90d supply, fill #0

## 2024-02-20 MED ORDER — AMPHETAMINE-DEXTROAMPHETAMINE 20 MG PO TABS
20.0000 mg | ORAL_TABLET | Freq: Every day | ORAL | 0 refills | Status: DC
  Filled 2024-03-09 – 2024-03-10 (×2): qty 30, 30d supply, fill #0

## 2024-02-20 MED ORDER — LISDEXAMFETAMINE DIMESYLATE 60 MG PO CAPS
60.0000 mg | ORAL_CAPSULE | Freq: Every day | ORAL | 0 refills | Status: DC
  Filled 2024-03-09 – 2024-03-10 (×2): qty 30, 30d supply, fill #0

## 2024-02-20 MED ORDER — REXULTI 2 MG PO TABS
2.0000 mg | ORAL_TABLET | Freq: Every day | ORAL | 5 refills | Status: AC
  Filled 2024-02-20: qty 30, 30d supply, fill #0
  Filled 2024-03-23: qty 30, 30d supply, fill #1

## 2024-02-21 ENCOUNTER — Other Ambulatory Visit (HOSPITAL_COMMUNITY): Payer: Self-pay

## 2024-03-09 ENCOUNTER — Other Ambulatory Visit (HOSPITAL_COMMUNITY): Payer: Self-pay

## 2024-03-10 ENCOUNTER — Other Ambulatory Visit (HOSPITAL_COMMUNITY): Payer: Self-pay

## 2024-03-23 ENCOUNTER — Other Ambulatory Visit (HOSPITAL_COMMUNITY): Payer: Self-pay

## 2024-03-23 ENCOUNTER — Other Ambulatory Visit: Payer: Self-pay

## 2024-03-27 ENCOUNTER — Ambulatory Visit

## 2024-03-27 VITALS — BP 104/65 | HR 74 | Temp 98.3°F | Resp 16

## 2024-03-27 DIAGNOSIS — K5100 Ulcerative (chronic) pancolitis without complications: Secondary | ICD-10-CM | POA: Diagnosis not present

## 2024-03-27 MED ORDER — VEDOLIZUMAB 300 MG IV SOLR
300.0000 mg | Freq: Once | INTRAVENOUS | Status: AC
  Administered 2024-03-27: 300 mg via INTRAVENOUS

## 2024-03-27 MED ORDER — VEDOLIZUMAB 300 MG IV SOLR
300.0000 mg | Freq: Once | INTRAVENOUS | Status: AC
  Filled 2024-03-27 (×3): qty 5

## 2024-03-27 NOTE — Progress Notes (Signed)
 Diagnosis:  Crohn's Disease  Provider:  Praveen Mannam MD  Procedure: IV Infusion  IV Type: Peripheral, IV Location: L Antecubital  , Entyvio  (Vedolizumab ), Dose: 300 mg  Infusion Start Time: 1514  Infusion Stop Time: 1546  Post Infusion IV Care: Peripheral IV Discontinued  Discharge: Condition: Good, Destination: Home . AVS Declined  Performed by:  Leita FORBES Miles, LPN

## 2024-03-29 ENCOUNTER — Ambulatory Visit

## 2024-04-09 ENCOUNTER — Other Ambulatory Visit (HOSPITAL_COMMUNITY): Payer: Self-pay

## 2024-04-09 MED ORDER — AMPHETAMINE-DEXTROAMPHETAMINE 20 MG PO TABS
20.0000 mg | ORAL_TABLET | Freq: Every day | ORAL | 0 refills | Status: DC
  Filled 2024-04-09: qty 30, 30d supply, fill #0

## 2024-04-09 MED ORDER — LISDEXAMFETAMINE DIMESYLATE 60 MG PO CAPS
60.0000 mg | ORAL_CAPSULE | Freq: Every day | ORAL | 0 refills | Status: AC
  Filled 2024-04-09: qty 30, 30d supply, fill #0

## 2024-04-11 ENCOUNTER — Other Ambulatory Visit (HOSPITAL_COMMUNITY): Payer: Self-pay

## 2024-04-11 MED ORDER — AMPHETAMINE-DEXTROAMPHETAMINE 20 MG PO TABS
20.0000 mg | ORAL_TABLET | Freq: Every day | ORAL | 0 refills | Status: AC
  Filled 2024-04-11: qty 30, 30d supply, fill #0

## 2024-04-12 ENCOUNTER — Other Ambulatory Visit (HOSPITAL_COMMUNITY): Payer: Self-pay

## 2024-04-12 ENCOUNTER — Other Ambulatory Visit (HOSPITAL_BASED_OUTPATIENT_CLINIC_OR_DEPARTMENT_OTHER)

## 2024-04-14 ENCOUNTER — Other Ambulatory Visit (HOSPITAL_BASED_OUTPATIENT_CLINIC_OR_DEPARTMENT_OTHER)

## 2024-05-22 ENCOUNTER — Ambulatory Visit

## 2024-09-17 ENCOUNTER — Ambulatory Visit: Admitting: Obstetrics and Gynecology
